# Patient Record
Sex: Female | Born: 1965
Health system: Southern US, Community
[De-identification: ages and names within clinical notes are randomized; demographics above are authoritative.]

## PROBLEM LIST (undated history)

## (undated) DIAGNOSIS — Z8719 Personal history of other diseases of the digestive system: Secondary | ICD-10-CM

## (undated) DIAGNOSIS — F32A Depression, unspecified: Secondary | ICD-10-CM

## (undated) DIAGNOSIS — M1990 Unspecified osteoarthritis, unspecified site: Secondary | ICD-10-CM

## (undated) DIAGNOSIS — I10 Essential (primary) hypertension: Secondary | ICD-10-CM

## (undated) DIAGNOSIS — F329 Major depressive disorder, single episode, unspecified: Secondary | ICD-10-CM

## (undated) DIAGNOSIS — F419 Anxiety disorder, unspecified: Secondary | ICD-10-CM

## (undated) DIAGNOSIS — R519 Headache, unspecified: Secondary | ICD-10-CM

## (undated) DIAGNOSIS — R51 Headache: Secondary | ICD-10-CM

## (undated) DIAGNOSIS — J189 Pneumonia, unspecified organism: Secondary | ICD-10-CM

## (undated) DIAGNOSIS — M792 Neuralgia and neuritis, unspecified: Secondary | ICD-10-CM

## (undated) DIAGNOSIS — K219 Gastro-esophageal reflux disease without esophagitis: Secondary | ICD-10-CM

## (undated) HISTORY — DX: Essential (primary) hypertension: I10

## (undated) HISTORY — DX: Anxiety disorder, unspecified: F41.9

## (undated) HISTORY — DX: Gastro-esophageal reflux disease without esophagitis: K21.9

## (undated) HISTORY — DX: Major depressive disorder, single episode, unspecified: F32.9

## (undated) HISTORY — DX: Depression, unspecified: F32.A

## (undated) HISTORY — PX: WISDOM TOOTH EXTRACTION: SHX21

---

## 1998-10-05 ENCOUNTER — Other Ambulatory Visit: Admission: RE | Admit: 1998-10-05 | Discharge: 1998-10-05 | Payer: Self-pay | Admitting: Family Medicine

## 2001-10-08 ENCOUNTER — Other Ambulatory Visit: Admission: RE | Admit: 2001-10-08 | Discharge: 2001-10-08 | Payer: Self-pay | Admitting: Family Medicine

## 2001-10-10 ENCOUNTER — Encounter: Payer: Self-pay | Admitting: Family Medicine

## 2001-10-10 ENCOUNTER — Ambulatory Visit (HOSPITAL_COMMUNITY): Admission: RE | Admit: 2001-10-10 | Discharge: 2001-10-10 | Payer: Self-pay | Admitting: Family Medicine

## 2002-10-09 ENCOUNTER — Other Ambulatory Visit: Admission: RE | Admit: 2002-10-09 | Discharge: 2002-10-09 | Payer: Self-pay | Admitting: Family Medicine

## 2004-07-12 ENCOUNTER — Ambulatory Visit (HOSPITAL_COMMUNITY): Admission: RE | Admit: 2004-07-12 | Discharge: 2004-07-12 | Payer: Self-pay | Admitting: Family Medicine

## 2004-12-14 ENCOUNTER — Other Ambulatory Visit: Admission: RE | Admit: 2004-12-14 | Discharge: 2004-12-14 | Payer: Self-pay | Admitting: Family Medicine

## 2007-04-19 ENCOUNTER — Encounter: Admission: RE | Admit: 2007-04-19 | Discharge: 2007-04-19 | Payer: Self-pay | Admitting: Family Medicine

## 2007-06-07 ENCOUNTER — Encounter: Admission: RE | Admit: 2007-06-07 | Discharge: 2007-06-07 | Payer: Self-pay | Admitting: Family Medicine

## 2008-08-21 ENCOUNTER — Encounter: Payer: Self-pay | Admitting: Gynecology

## 2008-08-21 ENCOUNTER — Ambulatory Visit: Payer: Self-pay | Admitting: Gynecology

## 2008-08-21 ENCOUNTER — Other Ambulatory Visit: Admission: RE | Admit: 2008-08-21 | Discharge: 2008-08-21 | Payer: Self-pay | Admitting: Gynecology

## 2008-09-18 ENCOUNTER — Ambulatory Visit: Payer: Self-pay | Admitting: Gynecology

## 2009-06-10 ENCOUNTER — Encounter: Admission: RE | Admit: 2009-06-10 | Discharge: 2009-06-10 | Payer: Self-pay | Admitting: Family Medicine

## 2010-06-20 ENCOUNTER — Encounter: Admission: RE | Admit: 2010-06-20 | Discharge: 2010-06-20 | Payer: Self-pay | Admitting: Family Medicine

## 2011-09-06 ENCOUNTER — Other Ambulatory Visit: Payer: Self-pay | Admitting: Family Medicine

## 2011-09-06 DIAGNOSIS — Z1231 Encounter for screening mammogram for malignant neoplasm of breast: Secondary | ICD-10-CM

## 2011-09-28 ENCOUNTER — Ambulatory Visit
Admission: RE | Admit: 2011-09-28 | Discharge: 2011-09-28 | Disposition: A | Discharge status: Home / Self Care | Payer: BC Managed Care – PPO | Source: Ambulatory Visit | Attending: Family Medicine | Admitting: Family Medicine

## 2011-09-28 DIAGNOSIS — Z1231 Encounter for screening mammogram for malignant neoplasm of breast: Secondary | ICD-10-CM

## 2012-03-27 ENCOUNTER — Other Ambulatory Visit: Payer: Self-pay | Admitting: Family Medicine

## 2012-03-27 ENCOUNTER — Other Ambulatory Visit (HOSPITAL_COMMUNITY)
Admission: RE | Admit: 2012-03-27 | Discharge: 2012-03-27 | Disposition: A | Discharge status: Home / Self Care | Payer: BC Managed Care – PPO | Source: Ambulatory Visit | Attending: Family Medicine | Admitting: Family Medicine

## 2012-03-27 DIAGNOSIS — Z0000 Encounter for general adult medical examination without abnormal findings: Secondary | ICD-10-CM | POA: Insufficient documentation

## 2012-04-01 ENCOUNTER — Other Ambulatory Visit: Payer: Self-pay | Admitting: Family Medicine

## 2012-04-01 DIAGNOSIS — R102 Pelvic and perineal pain: Secondary | ICD-10-CM

## 2012-04-05 ENCOUNTER — Ambulatory Visit
Admission: RE | Admit: 2012-04-05 | Discharge: 2012-04-05 | Disposition: A | Discharge status: Home / Self Care | Payer: BC Managed Care – PPO | Source: Ambulatory Visit | Attending: Family Medicine | Admitting: Family Medicine

## 2012-04-05 DIAGNOSIS — R102 Pelvic and perineal pain: Secondary | ICD-10-CM

## 2012-09-24 ENCOUNTER — Other Ambulatory Visit: Payer: Self-pay | Admitting: Family Medicine

## 2012-09-24 DIAGNOSIS — Z1231 Encounter for screening mammogram for malignant neoplasm of breast: Secondary | ICD-10-CM

## 2012-10-11 ENCOUNTER — Ambulatory Visit: Payer: BC Managed Care – PPO

## 2012-12-24 ENCOUNTER — Ambulatory Visit
Admission: RE | Admit: 2012-12-24 | Discharge: 2012-12-24 | Disposition: A | Discharge status: Home / Self Care | Payer: BC Managed Care – PPO | Source: Ambulatory Visit | Attending: Family Medicine | Admitting: Family Medicine

## 2012-12-24 DIAGNOSIS — Z1231 Encounter for screening mammogram for malignant neoplasm of breast: Secondary | ICD-10-CM

## 2013-03-21 ENCOUNTER — Other Ambulatory Visit (HOSPITAL_COMMUNITY)
Admission: RE | Admit: 2013-03-21 | Discharge: 2013-03-21 | Disposition: A | Discharge status: Home / Self Care | Payer: BC Managed Care – PPO | Source: Ambulatory Visit | Attending: Obstetrics and Gynecology | Admitting: Obstetrics and Gynecology

## 2013-03-21 ENCOUNTER — Other Ambulatory Visit: Payer: Self-pay | Admitting: Obstetrics and Gynecology

## 2013-03-21 DIAGNOSIS — Z01419 Encounter for gynecological examination (general) (routine) without abnormal findings: Secondary | ICD-10-CM | POA: Insufficient documentation

## 2013-03-21 DIAGNOSIS — Z1151 Encounter for screening for human papillomavirus (HPV): Secondary | ICD-10-CM | POA: Insufficient documentation

## 2013-12-18 LAB — HM MAMMOGRAPHY: HM MAMMO: NORMAL

## 2014-03-23 ENCOUNTER — Other Ambulatory Visit (HOSPITAL_COMMUNITY)
Admission: RE | Admit: 2014-03-23 | Discharge: 2014-03-23 | Disposition: A | Discharge status: Home / Self Care | Payer: 59 | Source: Ambulatory Visit | Attending: Obstetrics and Gynecology | Admitting: Obstetrics and Gynecology

## 2014-03-23 ENCOUNTER — Other Ambulatory Visit: Payer: Self-pay | Admitting: Obstetrics and Gynecology

## 2014-03-23 DIAGNOSIS — Z01419 Encounter for gynecological examination (general) (routine) without abnormal findings: Secondary | ICD-10-CM | POA: Insufficient documentation

## 2014-03-24 LAB — CYTOLOGY - PAP

## 2014-03-25 LAB — HM PAP SMEAR: HM Pap smear: NORMAL

## 2014-06-22 ENCOUNTER — Ambulatory Visit: Payer: BC Managed Care – PPO | Admitting: Internal Medicine

## 2014-07-13 ENCOUNTER — Ambulatory Visit: Payer: BC Managed Care – PPO | Admitting: Internal Medicine

## 2014-07-24 ENCOUNTER — Other Ambulatory Visit: Payer: Self-pay | Admitting: Internal Medicine

## 2014-07-24 ENCOUNTER — Ambulatory Visit (INDEPENDENT_AMBULATORY_CARE_PROVIDER_SITE_OTHER): Payer: 59 | Admitting: Internal Medicine

## 2014-07-24 ENCOUNTER — Other Ambulatory Visit (INDEPENDENT_AMBULATORY_CARE_PROVIDER_SITE_OTHER): Payer: 59

## 2014-07-24 ENCOUNTER — Encounter: Payer: Self-pay | Admitting: Internal Medicine

## 2014-07-24 VITALS — BP 150/100 | HR 74 | Temp 98.7°F | Resp 16 | Ht 63.0 in | Wt 178.0 lb

## 2014-07-24 DIAGNOSIS — Z1231 Encounter for screening mammogram for malignant neoplasm of breast: Secondary | ICD-10-CM | POA: Insufficient documentation

## 2014-07-24 DIAGNOSIS — Z0000 Encounter for general adult medical examination without abnormal findings: Secondary | ICD-10-CM

## 2014-07-24 DIAGNOSIS — I10 Essential (primary) hypertension: Secondary | ICD-10-CM

## 2014-07-24 LAB — COMPREHENSIVE METABOLIC PANEL
ALT: 14 U/L (ref 0–35)
AST: 19 U/L (ref 0–37)
Albumin: 4.1 g/dL (ref 3.5–5.2)
Alkaline Phosphatase: 71 U/L (ref 39–117)
BILIRUBIN TOTAL: 0.6 mg/dL (ref 0.2–1.2)
BUN: 10 mg/dL (ref 6–23)
CO2: 27 mEq/L (ref 19–32)
Calcium: 9.2 mg/dL (ref 8.4–10.5)
Chloride: 104 mEq/L (ref 96–112)
Creatinine, Ser: 0.7 mg/dL (ref 0.4–1.2)
GFR: 89.1 mL/min (ref >60.00)
GLUCOSE: 92 mg/dL (ref 70–99)
Potassium: 4.2 mEq/L (ref 3.5–5.1)
SODIUM: 138 meq/L (ref 135–145)
Total Protein: 7.1 g/dL (ref 6.0–8.3)

## 2014-07-24 LAB — URINALYSIS, ROUTINE W REFLEX MICROSCOPIC
Bilirubin Urine: NEGATIVE
Hgb urine dipstick: NEGATIVE
Ketones, ur: NEGATIVE
Leukocytes, UA: NEGATIVE
Nitrite: NEGATIVE
PH: 7 (ref 5.0–8.0)
RBC / HPF: NONE SEEN (ref 0–?)
Specific Gravity, Urine: 1.01 (ref 1.000–1.030)
TOTAL PROTEIN, URINE-UPE24: NEGATIVE
Urine Glucose: NEGATIVE
Urobilinogen, UA: 0.2 (ref 0.0–1.0)
WBC, UA: NONE SEEN (ref 0–?)

## 2014-07-24 LAB — CBC WITH DIFFERENTIAL/PLATELET
Basophils Absolute: 0.1 10*3/uL (ref 0.0–0.1)
Basophils Relative: 0.8 % (ref 0.0–3.0)
EOS PCT: 1.3 % (ref 0.0–5.0)
Eosinophils Absolute: 0.1 10*3/uL (ref 0.0–0.7)
HEMATOCRIT: 40.5 % (ref 36.0–46.0)
Hemoglobin: 13.7 g/dL (ref 12.0–15.0)
LYMPHS ABS: 1.7 10*3/uL (ref 0.7–4.0)
Lymphocytes Relative: 24.2 % (ref 12.0–46.0)
MCHC: 33.7 g/dL (ref 30.0–36.0)
MCV: 86.4 fl (ref 78.0–100.0)
MONOS PCT: 6.3 % (ref 3.0–12.0)
Monocytes Absolute: 0.4 10*3/uL (ref 0.1–1.0)
NEUTROS PCT: 67.4 % (ref 43.0–77.0)
Neutro Abs: 4.6 10*3/uL (ref 1.4–7.7)
Platelets: 267 10*3/uL (ref 150.0–400.0)
RBC: 4.69 Mil/uL (ref 3.87–5.11)
RDW: 12.8 % (ref 11.5–15.5)
WBC: 6.8 10*3/uL (ref 4.0–10.5)

## 2014-07-24 LAB — LIPID PANEL
Cholesterol: 181 mg/dL (ref 0–200)
HDL: 34.1 mg/dL — ABNORMAL LOW (ref >39.00)
LDL Cholesterol: 125 mg/dL — ABNORMAL HIGH (ref 0–99)
NonHDL: 146.9
Total CHOL/HDL Ratio: 5
Triglycerides: 108 mg/dL (ref 0.0–149.0)
VLDL: 21.6 mg/dL (ref 0.0–40.0)

## 2014-07-24 MED ORDER — AZILSARTAN-CHLORTHALIDONE 40-12.5 MG PO TABS: 1.0000 | ORAL_TABLET | Freq: Every day | ORAL | Status: DC

## 2014-07-24 NOTE — Progress Notes (Signed)
   Subjective:    Patient ID: Hannah Miranda, female    DOB: 1965-10-01, 48 y.o.   MRN: [id number]  Hypertension This is a recurrent problem. The current episode started more than 1 year ago. The problem has been gradually worsening since onset. The problem is uncontrolled. Associated symptoms include anxiety. Pertinent negatives include no blurred vision, chest pain, headaches, malaise/fatigue, neck pain, orthopnea, palpitations, peripheral edema, PND, shortness of breath or sweats. There are no associated agents to hypertension. Past treatments include nothing. The current treatment provides no improvement. Compliance problems include diet and exercise.       Review of Systems  Constitutional: Negative.  Negative for fever, chills, malaise/fatigue, diaphoresis, appetite change and fatigue.  HENT: Negative.   Eyes: Negative.  Negative for blurred vision.  Respiratory: Negative.  Negative for cough, choking, chest tightness, shortness of breath and stridor.   Cardiovascular: Negative.  Negative for chest pain, palpitations, orthopnea, leg swelling and PND.  Gastrointestinal: Negative.  Negative for nausea, vomiting, abdominal pain, diarrhea, constipation and blood in stool.  Endocrine: Negative.   Genitourinary: Negative.   Musculoskeletal: Negative.  Negative for back pain, arthralgias and neck pain.  Skin: Negative.   Allergic/Immunologic: Negative.   Neurological: Negative.  Negative for headaches.  Hematological: Negative.  Negative for adenopathy. Does not bruise/bleed easily.  Psychiatric/Behavioral: Negative.        Objective:   Physical Exam  Constitutional: She is oriented to person, place, and time. She appears well-developed and well-nourished. No distress.  HENT:  Head: Normocephalic and atraumatic.  Mouth/Throat: Oropharynx is clear and moist. No oropharyngeal exudate.  Eyes: Conjunctivae are normal. Right eye exhibits no discharge. Left eye exhibits no discharge. No  scleral icterus.  Neck: Normal range of motion. Neck supple. No JVD present. No tracheal deviation present. No thyromegaly present.  Cardiovascular: Normal rate, regular rhythm, normal heart sounds and intact distal pulses.  Exam reveals no gallop and no friction rub.   No murmur heard. Pulmonary/Chest: Effort normal and breath sounds normal. No stridor. No respiratory distress. She has no wheezes. She has no rales. She exhibits no tenderness.  Abdominal: Soft. Bowel sounds are normal. She exhibits no distension and no mass. There is no tenderness. There is no rebound and no guarding.  Musculoskeletal: Normal range of motion. She exhibits no edema or tenderness.  Lymphadenopathy:    She has no cervical adenopathy.  Neurological: She is oriented to person, place, and time.  Skin: Skin is warm and dry. No rash noted. She is not diaphoretic. No erythema. No pallor.  Vitals reviewed.     No results found for: WBC, HGB, HCT, PLT, GLUCOSE, CHOL, TRIG, HDL, LDLDIRECT, LDLCALC, ALT, AST, NA, K, CL, CREATININE, BUN, CO2, TSH, PSA, INR, GLUF, HGBA1C, MICROALBUR    Assessment & Plan:

## 2014-07-24 NOTE — Patient Instructions (Signed)

## 2014-07-25 NOTE — Assessment & Plan Note (Signed)
Her BP is not well controlled I have asked her to start am ARB/HCTZ combo I will check her labs today to look for end organ damage and secondary causes of HTN

## 2014-07-25 NOTE — Assessment & Plan Note (Signed)
Exam done Vaccines were reviewed and updated She is not due for PAP or mammo Labs ordered Pt ed material was given

## 2014-07-27 ENCOUNTER — Telehealth: Payer: Self-pay | Admitting: Internal Medicine

## 2014-07-27 NOTE — Telephone Encounter (Signed)
emmi mailed  °

## 2014-07-28 ENCOUNTER — Encounter: Payer: Self-pay | Admitting: Internal Medicine

## 2014-07-28 LAB — TSH: TSH: 2.054 u[IU]/mL (ref 0.350–4.500)

## 2014-09-01 ENCOUNTER — Ambulatory Visit (INDEPENDENT_AMBULATORY_CARE_PROVIDER_SITE_OTHER): Payer: 59 | Admitting: Internal Medicine

## 2014-09-01 ENCOUNTER — Encounter: Payer: Self-pay | Admitting: Internal Medicine

## 2014-09-01 VITALS — BP 120/78 | HR 84 | Temp 98.5°F | Resp 16 | Ht 63.0 in | Wt 175.0 lb

## 2014-09-01 DIAGNOSIS — I10 Essential (primary) hypertension: Secondary | ICD-10-CM

## 2014-09-01 DIAGNOSIS — R202 Paresthesia of skin: Secondary | ICD-10-CM

## 2014-09-01 DIAGNOSIS — G5601 Carpal tunnel syndrome, right upper limb: Secondary | ICD-10-CM | POA: Insufficient documentation

## 2014-09-01 NOTE — Patient Instructions (Signed)

## 2014-09-01 NOTE — Assessment & Plan Note (Signed)
Her BP is well controlled 

## 2014-09-01 NOTE — Progress Notes (Signed)
Pre visit review using our clinic review tool, if applicable. No additional management support is needed unless otherwise documented below in the visit note. 

## 2014-09-01 NOTE — Assessment & Plan Note (Signed)
In the left thigh her s/s are consistent with meralgia paresthetica but the numbness in her arms is a different story. I gave her an info sheet [facility] website about meralgia paresthetica and she will consider the treatment options. I have ordered a NCS/EMG to gather more info about the cause for her paresthesias.

## 2014-09-01 NOTE — Progress Notes (Signed)
   Subjective:    Patient ID: Hannah Miranda, female    DOB: February 28, 1966, 49 y.o.   MRN: [id number]  HPI  She complains of numbness and aching in her left distal, anterior, lateral thigh for several months, this worsened after a fall that she sustained about 1 month ago. The symptoms are intermittent but are worsening. She also complains of intermittent numbness in both hands for several months but that occurs mostly during the night.  Review of Systems  Constitutional: Negative.  Negative for fever, chills, diaphoresis, appetite change and fatigue.  HENT: Negative.   Eyes: Negative.   Respiratory: Negative.  Negative for cough, choking, chest tightness, shortness of breath and stridor.   Cardiovascular: Negative.  Negative for chest pain, palpitations and leg swelling.  Gastrointestinal: Negative.  Negative for nausea, vomiting, abdominal pain, diarrhea, constipation and blood in stool.  Endocrine: Negative.   Genitourinary: Negative.   Musculoskeletal: Positive for arthralgias (left thigh). Negative for myalgias, back pain, joint swelling and neck stiffness.  Skin: Negative.  Negative for rash.  Allergic/Immunologic: Negative.   Neurological: Positive for numbness. Negative for dizziness, tremors, seizures, syncope, facial asymmetry, speech difficulty, weakness, light-headedness and headaches.  Hematological: Negative.  Negative for adenopathy. Does not bruise/bleed easily.  Psychiatric/Behavioral: Negative for confusion, sleep disturbance, self-injury, decreased concentration and agitation. The patient is nervous/anxious.        Objective:   Physical Exam  Constitutional: She appears well-developed and well-nourished. No distress.  HENT:  Head: Normocephalic and atraumatic.  Mouth/Throat: Oropharynx is clear and moist. No oropharyngeal exudate.  Eyes: Conjunctivae are normal. Right eye exhibits no discharge. Left eye exhibits no discharge. No scleral icterus.  Neck: Normal range of  motion. Neck supple. No JVD present. No tracheal deviation present. No thyromegaly present.  Cardiovascular: Normal rate, regular rhythm, normal heart sounds and intact distal pulses.  Exam reveals no gallop and no friction rub.   No murmur heard. Pulmonary/Chest: Effort normal and breath sounds normal. No stridor. No respiratory distress. She has no wheezes. She has no rales. She exhibits no tenderness.  Abdominal: Soft. Bowel sounds are normal. She exhibits no distension and no mass. There is no tenderness. There is no rebound and no guarding.  Musculoskeletal: Normal range of motion. She exhibits no edema or tenderness.  Lymphadenopathy:    She has no cervical adenopathy.  Neurological: She is alert. She has normal strength. She displays abnormal reflex. She displays no atrophy and no tremor. No cranial nerve deficit or sensory deficit. She exhibits normal muscle tone. She displays a negative Romberg sign. She displays no seizure activity. Coordination and gait normal.  Reflex Scores:      Tricep reflexes are 1+ on the right side and 1+ on the left side.      Bicep reflexes are 2+ on the right side and 2+ on the left side.      Brachioradialis reflexes are 1+ on the right side and 1+ on the left side.      Patellar reflexes are 1+ on the right side and 1+ on the left side.      Achilles reflexes are 1+ on the right side and 0 on the left side. Skin: Skin is warm and dry. No rash noted. She is not diaphoretic. No erythema. No pallor.  Psychiatric: She has a normal mood and affect. Her behavior is normal. Judgment and thought content normal.  Vitals reviewed.         Assessment & Plan:

## 2014-10-08 ENCOUNTER — Other Ambulatory Visit: Payer: Self-pay | Admitting: *Deleted

## 2014-10-08 ENCOUNTER — Ambulatory Visit (INDEPENDENT_AMBULATORY_CARE_PROVIDER_SITE_OTHER): Payer: 59 | Admitting: Neurology

## 2014-10-08 DIAGNOSIS — R200 Anesthesia of skin: Secondary | ICD-10-CM

## 2014-10-08 NOTE — Procedures (Signed)
[facility]  [address], [address]  [place], [place] [address] Tel: [phone] Fax:  [phone] Test Date:  10/08/2014  Patient: Hannah Miranda DOB: [birth year] Physician: Nita Sickleonika Patel  Sex: Female Height: 5\' 3"  Ref Phys: Sanda LingerJones, Thomas   ID#: [id number] Temp: 32.0 Technician: Ala BentSusan Reid   Patient Complaints: Patient is a 49 year old female here for evaluation of generalized paresthesias of the arms and legs.   NCV & EMG Findings: Nerve conduction study of the right upper extremity, left lower extremity and additional studies of the left upper extremity shows:  1. Right median, ulnar, and radial sensory responses are within normal limits. Palmar studies are abnormal on the right and within normal limits on the left. 2. Right median and ulnar motor responses within normal limits. 3. Left sural and superficial peroneal sensory responses are within normal limits. 4. Left peroneal and tibial motor responses within normal limits.  Needle electrode examination of the right upper extremity and right lower extremity shows no evidence of active or chronic motor axon loss changes. In particular, motor unit configuration and recruitment pattern is within normal limits.  Impression: 1. Right median neuropathy at or distal to the wrist, consistent with the clinical diagnosis of carpal tunnel syndrome; very mild in degree electrically. 2. There is no evidence of carpal tunnel syndrome or a generalized sensorimotor polyneuropathy affecting the left side. Additionally, there is no evidence of a cervical/lumbosacral radiculopathy affecting the right side.   ___________________________ Nita Sickleonika Patel    Nerve Conduction Studies Anti Sensory Summary Table   Stim Site NR Peak (ms) Norm Peak (ms) P-T Amp (V) Norm P-T Amp  Right Median Anti Sensory (2nd Digit)  32C  Wrist    3.1 <3.4 51.4 >20  Right Radial Anti Sensory (Base 1st Digit)  32C  Wrist    2.0 <2.7 31.2 >18  Left Sup  Peroneal Anti Sensory (Ant Lat Mall)  32C  12 cm    2.1 <4.5 14.4 >5  Left Sural Anti Sensory (Lat Mall)  Calf    3.0 <4.5 15.9 >5  Right Ulnar Anti Sensory (5th Digit)  32C  Wrist    2.4 <3.1 32.5 >12   Motor Summary Table   Stim Site NR Onset (ms) Norm Onset (ms) O-P Amp (mV) Norm O-P Amp Site1 Site2 Delta-0 (ms) Dist (cm) Vel (m/s) Norm Vel (m/s)  Right Median Motor (Abd Poll Brev)  32C  Wrist    2.7 <3.9 9.7 >6 Elbow Wrist 4.0 25.0 63 >50  Elbow    6.7  9.4         Left Peroneal Motor (Ext Dig Brev)  32C  Ankle    2.7 <5.5 5.4 >3 B Fib Ankle 5.7 32.0 56 >40  B Fib    8.4  5.4  Poplt B Fib 1.3 7.5 58 >40  Poplt    9.7  5.3         Left Peroneal TA Motor (Tib Ant)  32C  Fib Head    2.0 <4.0 5.1 >4 Poplit Fib Head 1.4 8.0 57 >40  Poplit    3.4  4.2         Left Tibial Motor (Abd Hall Brev)  32C  Ankle    2.9 <6.0 9.0 >8 Knee Ankle 6.8 35.0 51 >40  Knee    9.7  8.1         Right Ulnar Motor (Abd Dig Minimi)  33C  Wrist    1.6 <3.1  8.2 >7 B Elbow Wrist 3.2 21.0 66 >50  B Elbow    4.8  8.0  A Elbow B Elbow 1.5 10.0 67 >50  A Elbow    6.3  8.0          Comparison Summary Table   Stim Site NR Peak (ms) Norm Peak (ms) P-T Amp (V) Site1 Site2 Delta-P (ms) Norm Delta (ms)  Left Median/Ulnar Palm Comparison (Wrist - 8cm)  33C  Median Palm    1.7 <2.2 59.4 Median Palm Ulnar Palm 0.2   Ulnar Palm    1.5 <2.2 11.5      Right Median/Ulnar Palm Comparison (Wrist - 8cm)  33C  Median Palm    1.8 <2.2 109.2 Median Palm Ulnar Palm 0.5   Ulnar Palm    1.3 <2.2 22.2        H Reflex Studies   NR H-Lat (ms) Lat Norm (ms) L-R H-Lat (ms)  Left Tibial (Gastroc)  32C     30.75 <35    EMG   Side Muscle Ins Act Fibs Psw Fasc Number Recrt Dur Dur. Amp Amp. Poly Poly. Comment  Right 1stDorInt Nml Nml Nml Nml Nml Nml Nml Nml Nml Nml Nml Nml N/A  Right Abd Poll Brev Nml Nml Nml Nml Nml Nml Nml Nml Nml Nml Nml Nml N/A  Right PronatorTeres Nml Nml Nml Nml Nml Nml Nml Nml Nml Nml Nml Nml  N/A  Right Biceps Nml Nml Nml Nml Nml Nml Nml Nml Nml Nml Nml Nml N/A  Right Triceps Nml Nml Nml Nml Nml Nml Nml Nml Nml Nml Nml Nml N/A  Right Deltoid Nml Nml Nml Nml Nml Nml Nml Nml Nml Nml Nml Nml N/A  Left AntTibialis Nml Nml Nml Nml Nml Nml Nml Nml Nml Nml Nml Nml N/A  Left Gastroc Nml Nml Nml Nml Nml Nml Nml Nml Nml Nml Nml Nml N/A  Left Flex Dig Long Nml Nml Nml Nml Nml Nml Nml Nml Nml Nml Nml Nml N/A  Left RectFemoris Nml Nml Nml Nml Nml Nml Nml Nml Nml Nml Nml Nml N/A  Left GluteusMed Nml Nml Nml Nml Nml Nml Nml Nml Nml Nml Nml Nml N/A      Waveforms:

## 2014-10-16 ENCOUNTER — Encounter: Payer: Self-pay | Admitting: Internal Medicine

## 2014-10-19 ENCOUNTER — Encounter: Payer: Self-pay | Admitting: Internal Medicine

## 2014-10-19 ENCOUNTER — Ambulatory Visit (INDEPENDENT_AMBULATORY_CARE_PROVIDER_SITE_OTHER): Payer: 59 | Admitting: Internal Medicine

## 2014-10-19 VITALS — BP 122/64 | HR 93 | Temp 98.7°F | Resp 16 | Ht 63.0 in | Wt 179.0 lb

## 2014-10-19 DIAGNOSIS — I10 Essential (primary) hypertension: Secondary | ICD-10-CM

## 2014-10-19 DIAGNOSIS — G5712 Meralgia paresthetica, left lower limb: Secondary | ICD-10-CM

## 2014-10-19 DIAGNOSIS — G5601 Carpal tunnel syndrome, right upper limb: Secondary | ICD-10-CM

## 2014-10-19 NOTE — Progress Notes (Signed)
Pre visit review using our clinic review tool, if applicable. No additional management support is needed unless otherwise documented below in the visit note. 

## 2014-10-21 ENCOUNTER — Encounter: Payer: Self-pay | Admitting: Internal Medicine

## 2014-10-21 DIAGNOSIS — G5712 Meralgia paresthetica, left lower limb: Secondary | ICD-10-CM | POA: Insufficient documentation

## 2014-10-21 MED ORDER — GABAPENTIN 100 MG PO CAPS: 100.0000 mg | ORAL_CAPSULE | Freq: Three times a day (TID) | ORAL | Status: DC

## 2014-10-21 NOTE — Assessment & Plan Note (Signed)
A splint was applied today She does not want to consider OT or surgery at this time

## 2014-10-21 NOTE — Assessment & Plan Note (Signed)
She is aware of the exercises and physical activity to help resolve this She also wants to try a medication for the discomfort - will offer neurontin

## 2014-10-21 NOTE — Progress Notes (Signed)
Subjective:    Patient ID: Hannah Miranda, female    DOB: 09/12/1965, 49 y.o.   MRN: [id number]  HPI Comments: She returns for f/up regarding paresthesias and recent NCS/EMG results. She has pain and paresthesia on right hand/wrist and left thigh.     Review of Systems  Constitutional: Negative.  Negative for fever, chills, diaphoresis, appetite change and fatigue.  HENT: Negative.   Eyes: Negative.   Respiratory: Negative.  Negative for cough, choking, chest tightness, shortness of breath and stridor.   Cardiovascular: Negative.  Negative for chest pain, palpitations and leg swelling.  Gastrointestinal: Negative.  Negative for nausea, vomiting, abdominal pain, diarrhea, constipation and blood in stool.  Endocrine: Negative.   Genitourinary: Negative.   Musculoskeletal: Positive for myalgias (left thigh) and arthralgias (rt hand and wrist). Negative for back pain, gait problem and neck pain.  Skin: Negative.  Negative for rash.  Allergic/Immunologic: Negative.   Neurological: Positive for numbness. Negative for dizziness, weakness and light-headedness.  Hematological: Negative.  Negative for adenopathy. Does not bruise/bleed easily.  Psychiatric/Behavioral: Negative.        Objective:   Physical Exam  Constitutional: She is oriented to person, place, and time. She appears well-developed and well-nourished. No distress.  HENT:  Head: Normocephalic and atraumatic.  Mouth/Throat: Oropharynx is clear and moist. No oropharyngeal exudate.  Eyes: Conjunctivae are normal. Right eye exhibits no discharge. Left eye exhibits no discharge. No scleral icterus.  Neck: Normal range of motion. Neck supple. No JVD present. No tracheal deviation present. No thyromegaly present.  Cardiovascular: Normal rate, regular rhythm, normal heart sounds and intact distal pulses.  Exam reveals no gallop and no friction rub.   No murmur heard. Pulmonary/Chest: Effort normal and breath sounds normal. No  stridor. No respiratory distress. She has no wheezes. She has no rales. She exhibits no tenderness.  Abdominal: Soft. Bowel sounds are normal. She exhibits no distension and no mass. There is no tenderness. There is no rebound and no guarding.  Musculoskeletal: Normal range of motion. She exhibits no edema or tenderness.  Lymphadenopathy:    She has no cervical adenopathy.  Neurological: She is oriented to person, place, and time.  Skin: Skin is warm and dry. No rash noted. She is not diaphoretic. No erythema. No pallor.  Psychiatric: She has a normal mood and affect. Her behavior is normal. Judgment and thought content normal.  Vitals reviewed.     NCV & EMG Findings: Nerve conduction study of the right upper extremity, left lower extremity and additional studies of the left upper extremity shows:  1. Right median, ulnar, and radial sensory responses are within normal limits. Palmar studies are abnormal on the right and within normal limits on the left. 2. Right median and ulnar motor responses within normal limits. 3. Left sural and superficial peroneal sensory responses are within normal limits. 4. Left peroneal and tibial motor responses within normal limits.  Needle electrode examination of the right upper extremity and right lower extremity shows no evidence of active or chronic motor axon loss changes. In particular, motor unit configuration and recruitment pattern is within normal limits.  Impression: 1. Right median neuropathy at or distal to the wrist, consistent with the clinical diagnosis of carpal tunnel syndrome; very mild in degree electrically. 2. There is no evidence of carpal tunnel syndrome or a generalized sensorimotor polyneuropathy affecting the left side. Additionally, there is no evidence of a cervical/lumbosacral radiculopathy affecting the right side.   ___________________________  Assessment & Plan:

## 2014-10-21 NOTE — Assessment & Plan Note (Signed)
Her BP is well controlled 

## 2014-10-23 ENCOUNTER — Ambulatory Visit: Payer: 59 | Admitting: Internal Medicine

## 2014-11-25 ENCOUNTER — Other Ambulatory Visit: Payer: Self-pay | Admitting: Internal Medicine

## 2015-01-26 ENCOUNTER — Ambulatory Visit (INDEPENDENT_AMBULATORY_CARE_PROVIDER_SITE_OTHER): Payer: 59 | Admitting: Internal Medicine

## 2015-01-26 ENCOUNTER — Encounter: Payer: Self-pay | Admitting: Internal Medicine

## 2015-01-26 VITALS — BP 122/84 | HR 91 | Temp 98.3°F | Resp 18 | Wt 175.0 lb

## 2015-01-26 DIAGNOSIS — J029 Acute pharyngitis, unspecified: Secondary | ICD-10-CM

## 2015-01-26 DIAGNOSIS — J309 Allergic rhinitis, unspecified: Secondary | ICD-10-CM

## 2015-01-26 MED ORDER — CEPHALEXIN 500 MG PO CAPS: 500.0000 mg | ORAL_CAPSULE | Freq: Two times a day (BID) | ORAL | Status: DC

## 2015-01-26 NOTE — Progress Notes (Signed)
Pre visit review using our clinic review tool, if applicable. No additional management support is needed unless otherwise documented below in the visit note. 

## 2015-01-26 NOTE — Progress Notes (Signed)
   Subjective:    Patient ID: Hannah Miranda, female    DOB: 21-May-1966, 49 y.o.   MRN: [id number]  HPI Her symptoms began 4 days ago as sore throat which has been severe and progressive. This is associated with malaise, rhinitis, sneezing, and watery eyes. Cough has been nonproductive. She's had some fever and chills.  She takes Singulair for seasonal allergies with good response. She's never smoked. She has no history of asthma.  Review of Systems  She denies frontal headache, facial pain, nasal purulence, dental pain, wheezing, shortness of breath.       Objective:   Physical Exam  General appearance:Adequately nourished; no acute distress or increased work of breathing is present.    Lymphatic: She has shotty cervical lymphadenopathy, greater on the left than the right. No axillary lymphadenopathy.  Eyes: No conjunctival inflammation or lid edema is present. There is no scleral icterus.  Ears:  External ear exam shows no significant lesions or deformities.  Otoscopic examination reveals clear canals, tympanic membranes are intact bilaterally without bulging, retraction, inflammation or discharge.  Nose:  External nasal examination shows no deformity or inflammation. Nasal mucosa are markedly erythematous without lesions or exudates No septal dislocation or deviation.No obstruction to airflow.   Oral exam: Dental hygiene is good; lips and gums are healthy appearing.There is no oropharyngeal erythema or exudate .  Neck:  No deformities, thyromegaly, masses, or tenderness noted.   Supple with full range of motion without pain.   Heart:  Normal rate and regular rhythm. S1 and S2 normal without gallop, murmur, click, rub or other extra sounds.   Lungs:Chest clear to auscultation; no wheezes, rhonchi,rales ,or rubs present.  Extremities:  No cyanosis, edema, or clubbing  noted    Skin: Warm & dry w/o tenting or jaundice. No significant lesions or rash.        Assessment &  Plan:  #1 pharyngitis .  #2 allergic rhinitis.  See orders and recommendations

## 2015-01-26 NOTE — Patient Instructions (Signed)
Plain Mucinex (NOT D) for thick secretions ;force NON dairy fluids .   Nasal cleansing in the shower as discussed with lather of mild shampoo.After 10 seconds wash off lather while  exhaling through nostrils. Make sure that all residual soap is removed to prevent irritation.  Fluticasone 1 spray in each nostril twice a day as needed. Use the "crossover" technique into opposite nostril spraying toward opposite ear @ 45 degree angle, not straight up into nostril.  Use a Neti pot daily only  as needed for significant sinus congestion; going from open side to congested side . Plain Allegra (NOT D )  160 daily , Loratidine 10 mg , OR Zyrtec 10 mg @ bedtime  as needed for itchy eyes & sneezing.    Zicam Melts or Zinc lozenges as per package label for sore throat .  Complementary options to boost immunity include  vitamin C 2000 mg daily; & Echinacea for 4-7 days.

## 2015-02-23 ENCOUNTER — Ambulatory Visit (INDEPENDENT_AMBULATORY_CARE_PROVIDER_SITE_OTHER): Payer: 59 | Admitting: Internal Medicine

## 2015-02-23 ENCOUNTER — Encounter: Payer: Self-pay | Admitting: Internal Medicine

## 2015-02-23 ENCOUNTER — Ambulatory Visit (INDEPENDENT_AMBULATORY_CARE_PROVIDER_SITE_OTHER)
Admission: RE | Admit: 2015-02-23 | Discharge: 2015-02-23 | Disposition: A | Discharge status: Home / Self Care | Payer: 59 | Source: Ambulatory Visit | Attending: Internal Medicine | Admitting: Internal Medicine

## 2015-02-23 VITALS — BP 112/86 | HR 74 | Temp 98.3°F | Resp 16 | Ht 63.0 in | Wt 179.0 lb

## 2015-02-23 DIAGNOSIS — R059 Cough, unspecified: Secondary | ICD-10-CM

## 2015-02-23 DIAGNOSIS — R05 Cough: Secondary | ICD-10-CM

## 2015-02-23 DIAGNOSIS — J189 Pneumonia, unspecified organism: Secondary | ICD-10-CM

## 2015-02-23 MED ORDER — HYDROCODONE-HOMATROPINE 5-1.5 MG/5ML PO SYRP
5.0000 mL | ORAL_SOLUTION | Freq: Three times a day (TID) | ORAL | Status: DC | PRN
Start: 2015-02-23 — End: 2015-05-03

## 2015-02-23 MED ORDER — MOXIFLOXACIN HCL 400 MG PO TABS
400.0000 mg | ORAL_TABLET | Freq: Every day | ORAL | Status: AC
Start: 2015-02-23 — End: 2015-03-02

## 2015-02-23 NOTE — Progress Notes (Signed)
Pre visit review using our clinic review tool, if applicable. No additional management support is needed unless otherwise documented below in the visit note. 

## 2015-02-23 NOTE — Progress Notes (Signed)
Subjective:  Patient ID: Hannah Miranda, female    DOB: [birth year]  Age: 49 y.o. MRN: [id number]  CC: Cough   HPI Hannah Kindredngela C Chuang presents for cough productive of thick phlegm for about 1 week. She also complains of chills and sore throat. The cough can be so severe at times that she has developed right sided chest soreness.  Outpatient Prescriptions Prior to Visit  Medication Sig Dispense Refill  . ALPRAZolam (XANAX) 0.5 MG tablet TAKE 1 TABLET BY MOUTH TWICE A DAY AS NEEDED 90 tablet 2  . Azilsartan-Chlorthalidone (EDARBYCLOR) 40-12.5 MG TABS Take 1 tablet by mouth daily. 30 tablet 11  . cetirizine (ZYRTEC) 10 MG tablet Take 10 mg by mouth daily.    . mometasone (NASONEX) 50 MCG/ACT nasal spray Place 2 sprays into the nose as needed.    . montelukast (SINGULAIR) 10 MG tablet Take 10 mg by mouth as needed. SPRING [employer]ALL    . omeprazole (PRILOSEC) 20 MG capsule Take 20 mg by mouth daily.    . cephALEXin (KEFLEX) 500 MG capsule Take 1 capsule (500 mg total) by mouth 2 (two) times daily. 14 capsule 0  . gabapentin (NEURONTIN) 100 MG capsule Take 1 capsule (100 mg total) by mouth 3 (three) times daily. (Patient not taking: Reported on 02/23/2015) 90 capsule 3   No facility-administered medications prior to visit.    ROS Review of Systems  Constitutional: Positive for chills. Negative for fever, diaphoresis, activity change, appetite change, fatigue and unexpected weight change.  HENT: Positive for sore throat. Negative for congestion, sinus pressure, tinnitus and trouble swallowing.   Eyes: Negative.   Respiratory: Positive for cough. Negative for apnea, choking, chest tightness, shortness of breath, wheezing and stridor.   Cardiovascular: Positive for chest pain. Negative for palpitations and leg swelling.  Gastrointestinal: Negative.  Negative for nausea, vomiting, abdominal pain, diarrhea, constipation and blood in stool.  Endocrine: Negative.   Genitourinary: Negative.     Musculoskeletal: Negative.  Negative for myalgias, back pain, joint swelling and arthralgias.  Skin: Negative.  Negative for rash.  Allergic/Immunologic: Negative.   Neurological: Negative.   Hematological: Negative.  Negative for adenopathy. Does not bruise/bleed easily.  Psychiatric/Behavioral: Negative.     Objective:  BP 112/86 mmHg  Pulse 74  Temp(Src) 98.3 F (36.8 C) (Oral)  Resp 16  Ht 5\' 3"  (1.6 m)  Wt 179 lb (81.194 kg)  BMI 31.72 kg/m2  SpO2 97%  BP Readings from Last 3 Encounters:  02/23/15 112/86  01/26/15 122/84  10/19/14 122/64    Wt Readings from Last 3 Encounters:  02/23/15 179 lb (81.194 kg)  01/26/15 175 lb (79.379 kg)  10/19/14 179 lb (81.194 kg)    Physical Exam  Constitutional: She is oriented to person, place, and time.  Non-toxic appearance. She does not have a sickly appearance. She does not appear ill. No distress.  HENT:  Mouth/Throat: Oropharynx is clear and moist. No oropharyngeal exudate.  Eyes: Conjunctivae are normal. Right eye exhibits no discharge. Left eye exhibits no discharge. No scleral icterus.  Neck: Normal range of motion. Neck supple. No JVD present. No tracheal deviation present. No thyromegaly present.  Cardiovascular: Normal rate, regular rhythm, normal heart sounds and intact distal pulses.  Exam reveals no gallop and no friction rub.   No murmur heard. Pulmonary/Chest: Effort normal and breath sounds normal. No stridor. No respiratory distress. She has no wheezes. She has no rales. She exhibits no tenderness.  Abdominal: Soft. Bowel sounds are  normal. She exhibits no distension and no mass. There is no tenderness. There is no rebound and no guarding.  Musculoskeletal: Normal range of motion. She exhibits no edema or tenderness.  Lymphadenopathy:    She has no cervical adenopathy.  Neurological: She is oriented to person, place, and time.  Skin: Skin is warm and dry. No rash noted. She is not diaphoretic. No erythema. No  pallor.    Lab Results  Component Value Date   WBC 6.8 07/24/2014   HGB 13.7 07/24/2014   HCT 40.5 07/24/2014   PLT 267.0 07/24/2014   GLUCOSE 92 07/24/2014   CHOL 181 07/24/2014   TRIG 108.0 07/24/2014   HDL 34.10* 07/24/2014   LDLCALC 125* 07/24/2014   ALT 14 07/24/2014   AST 19 07/24/2014   NA 138 07/24/2014   K 4.2 07/24/2014   CL 104 07/24/2014   CREATININE 0.7 07/24/2014   BUN 10 07/24/2014   CO2 27 07/24/2014   TSH 2.054 07/24/2014    Dg Chest 2 View  02/23/2015   CLINICAL DATA:  Chest tightness/pain and cough for 3 days  EXAM: CHEST  2 VIEW  COMPARISON:  None.  FINDINGS: Lungs are clear. Heart size and pulmonary vascularity are normal. No adenopathy. No pneumothorax. No bone lesions.  IMPRESSION: No edema or consolidation.   Electronically Signed   By: Bretta Bang III M.D.   On: 02/23/2015 08:56    Assessment & Plan:   Lashundra was seen today for cough.  Diagnoses and all orders for this visit:  Cough- her chest x-ray is normal, will control the cough with Hycodan Orders: -     DG Chest 2 View; Future -     HYDROcodone-homatropine (HYCODAN) 5-1.5 MG/5ML syrup; Take 5 mLs by mouth every 8 (eight) hours as needed for cough.  CAP (community acquired pneumonia)- though her chest x-ray is normal she has signs and symptoms suspicious for pneumonia. Will treat with Avelox. Orders: -     moxifloxacin (AVELOX) 400 MG tablet; Take 1 tablet (400 mg total) by mouth daily. -     HYDROcodone-homatropine (HYCODAN) 5-1.5 MG/5ML syrup; Take 5 mLs by mouth every 8 (eight) hours as needed for cough.   I have discontinued Ms. Burchfield's gabapentin and cephALEXin. I am also having her start on moxifloxacin and HYDROcodone-homatropine. Additionally, I am having her maintain her omeprazole, cetirizine, montelukast, mometasone, Azilsartan-Chlorthalidone, and ALPRAZolam.  Meds ordered this encounter  Medications  . moxifloxacin (AVELOX) 400 MG tablet    Sig: Take 1 tablet (400 mg  total) by mouth daily.    Dispense:  7 tablet    Refill:  0  . HYDROcodone-homatropine (HYCODAN) 5-1.5 MG/5ML syrup    Sig: Take 5 mLs by mouth every 8 (eight) hours as needed for cough.    Dispense:  120 mL    Refill:  0     Follow-up: Return in about 3 weeks (around 03/16/2015).  Sanda Linger, MD

## 2015-02-23 NOTE — Patient Instructions (Signed)
Cough, Adult  A cough is a reflex that helps clear your throat and airways. It can help heal the body or may be a reaction to an irritated airway. A cough may only last 2 or 3 weeks (acute) or may last more than 8 weeks (chronic).  CAUSES Acute cough:  Viral or bacterial infections. Chronic cough:  Infections.  Allergies.  Asthma.  Post-nasal drip.  Smoking.  Heartburn or acid reflux.  Some medicines.  Chronic lung problems (COPD).  Cancer. SYMPTOMS   Cough.  Fever.  Chest pain.  Increased breathing rate.  High-pitched whistling sound when breathing (wheezing).  Colored mucus that you cough up (sputum). TREATMENT   A bacterial cough may be treated with antibiotic medicine.  A viral cough must run its course and will not respond to antibiotics.  Your caregiver may recommend other treatments if you have a chronic cough. HOME CARE INSTRUCTIONS   Only take over-the-counter or prescription medicines for pain, discomfort, or fever as directed by your caregiver. Use cough suppressants only as directed by your caregiver.  Use a cold steam vaporizer or humidifier in your bedroom or home to help loosen secretions.  Sleep in a semi-upright position if your cough is worse at night.  Rest as needed.  Stop smoking if you smoke. SEEK IMMEDIATE MEDICAL CARE IF:   You have pus in your sputum.  Your cough starts to worsen.  You cannot control your cough with suppressants and are losing sleep.  You begin coughing up blood.  You have difficulty breathing.  You develop pain which is getting worse or is uncontrolled with medicine.  You have a fever. MAKE SURE YOU:   Understand these instructions.  Will watch your condition.  Will get help right away if you are not doing well or get worse. Document Released: 01/20/2011 Document Revised: 10/16/2011 Document Reviewed: 01/20/2011 [employer] Patient Information 2015 [employer]. This information is not intended  to replace advice given to you by your health care provider. Make sure you discuss any questions you have with your health care provider.  

## 2015-03-29 ENCOUNTER — Other Ambulatory Visit (HOSPITAL_COMMUNITY)
Admission: RE | Admit: 2015-03-29 | Discharge: 2015-03-29 | Disposition: A | Discharge status: Home / Self Care | Payer: 59 | Source: Ambulatory Visit | Attending: Obstetrics and Gynecology | Admitting: Obstetrics and Gynecology

## 2015-03-29 ENCOUNTER — Other Ambulatory Visit: Payer: Self-pay | Admitting: Obstetrics and Gynecology

## 2015-03-29 DIAGNOSIS — Z1151 Encounter for screening for human papillomavirus (HPV): Secondary | ICD-10-CM | POA: Diagnosis present

## 2015-03-29 DIAGNOSIS — Z01411 Encounter for gynecological examination (general) (routine) with abnormal findings: Secondary | ICD-10-CM | POA: Diagnosis present

## 2015-03-31 LAB — CYTOLOGY - PAP

## 2015-05-03 ENCOUNTER — Ambulatory Visit (INDEPENDENT_AMBULATORY_CARE_PROVIDER_SITE_OTHER)
Admission: RE | Admit: 2015-05-03 | Discharge: 2015-05-03 | Disposition: A | Discharge status: Home / Self Care | Payer: 59 | Source: Ambulatory Visit | Attending: Internal Medicine | Admitting: Internal Medicine

## 2015-05-03 ENCOUNTER — Ambulatory Visit (INDEPENDENT_AMBULATORY_CARE_PROVIDER_SITE_OTHER): Payer: 59 | Admitting: Internal Medicine

## 2015-05-03 ENCOUNTER — Encounter: Payer: Self-pay | Admitting: Internal Medicine

## 2015-05-03 VITALS — BP 110/86 | HR 67 | Temp 98.3°F | Ht 63.0 in | Wt 178.0 lb

## 2015-05-03 DIAGNOSIS — M5412 Radiculopathy, cervical region: Secondary | ICD-10-CM

## 2015-05-03 DIAGNOSIS — F411 Generalized anxiety disorder: Secondary | ICD-10-CM

## 2015-05-03 DIAGNOSIS — M5030 Other cervical disc degeneration, unspecified cervical region: Secondary | ICD-10-CM | POA: Insufficient documentation

## 2015-05-03 MED ORDER — OMEPRAZOLE 20 MG PO CPDR: 20.0000 mg | DELAYED_RELEASE_CAPSULE | Freq: Every day | ORAL | Status: DC

## 2015-05-03 MED ORDER — NAPROXEN 375 MG PO TABS
375.0000 mg | ORAL_TABLET | Freq: Two times a day (BID) | ORAL | Status: DC
Start: 2015-05-03 — End: 2015-10-13

## 2015-05-03 MED ORDER — ALPRAZOLAM 0.5 MG PO TABS: 0.5000 mg | ORAL_TABLET | Freq: Two times a day (BID) | ORAL | Status: DC | PRN

## 2015-05-03 NOTE — Patient Instructions (Signed)

## 2015-05-03 NOTE — Progress Notes (Signed)
Pre visit review using our clinic review tool, if applicable. No additional management support is needed unless otherwise documented below in the visit note. 

## 2015-05-03 NOTE — Progress Notes (Signed)
Subjective:  Patient ID: Hannah Miranda, female    DOB: [birth year]  Age: 49 y.o. MRN: [id number]  CC: Neck Pain   HPI Hannah Miranda presents for the complaint of left sided lower neck pain. She was reaching into the back of her car today and felt the sudden onset of left lower sharp neck pain that radiates to the left posterior shoulder, she took a dose of advil and xanax and the pain lessened. She has had recurrent episodes of this over the last 10 years but this is the most severe pain she has experienced.  Outpatient Prescriptions Prior to Visit  Medication Sig Dispense Refill  . Azilsartan-Chlorthalidone (EDARBYCLOR) 40-12.5 MG TABS Take 1 tablet by mouth daily. 30 tablet 11  . cetirizine (ZYRTEC) 10 MG tablet Take 10 mg by mouth daily.    . mometasone (NASONEX) 50 MCG/ACT nasal spray Place 2 sprays into the nose as needed.    . montelukast (SINGULAIR) 10 MG tablet Take 10 mg by mouth as needed. SPR[place] [employer] [place]ALL    . ALPRAZolam (XANAX) 0.5 MG tablet TAKE 1 TABLET BY MOUTH TWICE A DAY AS NEEDED 90 tablet 2  . HYDROcodone-homatropine (HYCODAN) 5-1.5 MG/5ML syrup Take 5 mLs by mouth every 8 (eight) hours as needed for cough. 120 mL 0  . omeprazole (PRILOSEC) 20 MG capsule Take 20 mg by mouth daily.     No facility-administered medications prior to visit.    ROS Review of Systems  Constitutional: Negative.   HENT: Negative.   Eyes: Negative.   Respiratory: Negative.  Negative for cough, choking, chest tightness, shortness of breath and stridor.   Cardiovascular: Negative.  Negative for chest pain and leg swelling.  Gastrointestinal: Negative.  Negative for nausea, vomiting, abdominal pain, diarrhea, constipation and blood in stool.  Endocrine: Negative.   Genitourinary: Negative.   Musculoskeletal: Positive for neck pain. Negative for myalgias, back pain, arthralgias and neck stiffness.  Skin: Negative.  Negative for rash.  Allergic/Immunologic: Negative.   Neurological: Negative.   Negative for dizziness, seizures, facial asymmetry, weakness and numbness.  Hematological: Negative.  Negative for adenopathy. Does not bruise/bleed easily.  Psychiatric/Behavioral: Positive for sleep disturbance. Negative for suicidal ideas, behavioral problems, confusion, self-injury, dysphoric mood and decreased concentration. The patient is nervous/anxious.     Objective:  BP 110/86 mmHg  Pulse 67  Temp(Src) 98.3 F (36.8 C) (Oral)  Ht  (1.6 m)  Wt 178 lb (80.74 kg)  BMI 31.54 kg/m2  SpO2 97%  BP Readings from Last 3 Encounters:  05/03/15 110/86  02/23/15 112/86  01/26/15 122/84    Wt Readings from Last 3 Encounters:  05/03/15 178 lb (80.74 kg)  02/23/15 179 lb (81.194 kg)  01/26/15 175 lb (79.379 kg)    Physical Exam  Constitutional: No distress.  HENT:  Mouth/Throat: Oropharynx is clear and moist. No oropharyngeal exudate.  Eyes: Conjunctivae are normal. Right eye exhibits no discharge. Left eye exhibits no discharge. No scleral icterus.  Neck: Normal range of motion. Neck supple. No JVD present. No tracheal deviation present. No thyromegaly present.  Cardiovascular: Normal rate, regular rhythm, normal heart sounds and intact distal pulses.  Exam reveals no gallop and no friction rub.   No murmur heard. Pulmonary/Chest: Effort normal and breath sounds normal. No stridor. No respiratory distress. She has no wheezes. She has no rales. She exhibits no tenderness.  Abdominal: Soft. Bowel sounds are normal. She exhibits no distension and no mass. There is no tenderness. There is  no rebound and no guarding.  Musculoskeletal: Normal range of motion. She exhibits no edema or tenderness.       Cervical back: Normal. She exhibits normal range of motion, no tenderness, no bony tenderness, no swelling, no edema, no deformity, no laceration, no pain, no spasm and normal pulse.  Lymphadenopathy:    She has no cervical adenopathy.  Neurological: She is alert. She displays no  atrophy, no tremor and normal reflexes. No cranial nerve deficit or sensory deficit. She exhibits abnormal muscle tone. She displays a negative Romberg sign. She displays no seizure activity. Coordination and gait normal.  Reflex Scores:      Tricep reflexes are 1+ on the right side and 1+ on the left side.      Bicep reflexes are 1+ on the right side and 1+ on the left side.      Brachioradialis reflexes are 1+ on the right side and 1+ on the left side.      Patellar reflexes are 1+ on the right side and 1+ on the left side.      Achilles reflexes are 1+ on the right side and 1+ on the left side. There is mild diffuse weakness in the LUE  Skin: Skin is warm and dry. No rash noted. She is not diaphoretic. No erythema. No pallor.  Psychiatric: She has a normal mood and affect. Her behavior is normal. Judgment and thought content normal.  Vitals reviewed.   Lab Results  Component Value Date   WBC 6.8 07/24/2014   HGB 13.7 07/24/2014   HCT 40.5 07/24/2014   PLT 267.0 07/24/2014   GLUCOSE 92 07/24/2014   CHOL 181 07/24/2014   TRIG 108.0 07/24/2014   HDL 34.10* 07/24/2014   LDLCALC 125* 07/24/2014   ALT 14 07/24/2014   AST 19 07/24/2014   NA 138 07/24/2014   K 4.2 07/24/2014   CL 104 07/24/2014   CREATININE 0.7 07/24/2014   BUN 10 07/24/2014   CO2 27 07/24/2014   TSH 2.054 07/24/2014    Dg Cervical Spine Complete  05/03/2015   CLINICAL DATA:  Chronic neck and left shoulder pain, intermittent for 10 years, recent worsening and shoulder pain, no known injury.  EXAM: CERVICAL SPINE  4+ VIEWS  COMPARISON:  04/19/2007.  FINDINGS: The cervical spine is visualized from the occiput to the cervicothoracic junction. There is slight straightening of the normal cervical lordosis without subluxation or fracture. Alignment is anatomic. Endplate degenerative changes at C4-5, C5-6 and C6-7 have progressed significantly from 04/19/2007. Associated loss of disc space height at C5-6 and C6-7.  Uncovertebral hypertrophy and facet sclerosis at multiple levels as well. Mild neural foraminal narrowing bilaterally at C4-5 and possibly C5-6, left greater than right. Visualized lung apices are clear.  IMPRESSION: 1. No acute findings. 2. Progressive degenerative disc disease at C4-5, C5-6 and C6-7.   Electronically Signed   By: Leanna Battles M.D.   On: 05/03/2015 12:11    Assessment & Plan:   Hannah Miranda was seen today for neck pain.  Diagnoses and all orders for this visit:  Radiculitis of left cervical region- her plain films and exam are abnormal. I have ordered an MRI to see if there is spinal stenosis, nerve impingement, tumor, or mass. She will start a high dose nsaid. -     DG Cervical Spine Complete; Future -     MR Cervical Spine Wo Contrast; Future -     naproxen (NAPROSYN) 375 MG tablet; Take 1 tablet (375  mg total) by mouth 2 (two) times daily with a meal.  GAD (generalized anxiety disorder) -     ALPRAZolam (XANAX) 0.5 MG tablet; Take 1 tablet (0.5 mg total) by mouth 2 (two) times daily as needed.  Other orders -     omeprazole (PRILOSEC) 20 MG capsule; Take 1 capsule (20 mg total) by mouth daily.   I have discontinued Ms. Kopper's HYDROcodone-homatropine. I have also changed her omeprazole and ALPRAZolam. Additionally, I am having her start on naproxen. Lastly, I am having her maintain her cetirizine, montelukast, mometasone, and Azilsartan-Chlorthalidone.  Meds ordered this encounter  Medications  . omeprazole (PRILOSEC) 20 MG capsule    Sig: Take 1 capsule (20 mg total) by mouth daily.    Dispense:  30 capsule    Refill:  11  . ALPRAZolam (XANAX) 0.5 MG tablet    Sig: Take 1 tablet (0.5 mg total) by mouth 2 (two) times daily as needed.    Dispense:  90 tablet    Refill:  2    This request is for a new prescription for a controlled substance as required by Federal/State law..  . naproxen (NAPROSYN) 375 MG tablet    Sig: Take 1 tablet (375 mg total) by mouth 2 (two)  times daily with a meal.    Dispense:  60 tablet    Refill:  1     Follow-up: Return in about 3 weeks (around 05/24/2015).  Sanda Linger, MD

## 2015-05-04 ENCOUNTER — Encounter: Payer: Self-pay | Admitting: Internal Medicine

## 2015-05-16 ENCOUNTER — Ambulatory Visit
Admission: RE | Admit: 2015-05-16 | Discharge: 2015-05-16 | Disposition: A | Discharge status: Home / Self Care | Payer: 59 | Source: Ambulatory Visit | Attending: Internal Medicine | Admitting: Internal Medicine

## 2015-05-16 DIAGNOSIS — M5412 Radiculopathy, cervical region: Secondary | ICD-10-CM

## 2015-05-17 ENCOUNTER — Other Ambulatory Visit: Payer: Self-pay | Admitting: Internal Medicine

## 2015-05-17 ENCOUNTER — Encounter: Payer: Self-pay | Admitting: Internal Medicine

## 2015-05-17 DIAGNOSIS — E041 Nontoxic single thyroid nodule: Secondary | ICD-10-CM

## 2015-05-24 ENCOUNTER — Ambulatory Visit (INDEPENDENT_AMBULATORY_CARE_PROVIDER_SITE_OTHER): Payer: 59 | Admitting: Internal Medicine

## 2015-05-24 ENCOUNTER — Encounter: Payer: Self-pay | Admitting: Internal Medicine

## 2015-05-24 VITALS — BP 110/70 | HR 85 | Temp 98.4°F | Resp 16 | Ht 63.0 in | Wt 177.0 lb

## 2015-05-24 DIAGNOSIS — Z23 Encounter for immunization: Secondary | ICD-10-CM | POA: Diagnosis not present

## 2015-05-24 DIAGNOSIS — I10 Essential (primary) hypertension: Secondary | ICD-10-CM

## 2015-05-24 DIAGNOSIS — M5030 Other cervical disc degeneration, unspecified cervical region: Secondary | ICD-10-CM | POA: Diagnosis not present

## 2015-05-24 NOTE — Patient Instructions (Signed)
Acute Torticollis °Torticollis is a condition in which the muscles of the neck tighten (contract) abnormally, causing the neck to twist and the head to move into an unnatural position. Torticollis that develops suddenly is called acute torticollis. If torticollis becomes chronic and is left untreated, the face and neck can become deformed. °CAUSES °This condition may be caused by: °· Sleeping in an awkward position (common). °· Extending or twisting the neck muscles beyond their normal position. °· Infection. °In some cases, the cause may not be known. °SYMPTOMS °Symptoms of this condition include: °· An unnatural position of the head. °· Neck pain. °· A limited ability to move the neck. °· Twisting of the neck to one side. °DIAGNOSIS °This condition is diagnosed with a physical exam. You may also have imaging tests, such as an X-ray, CT scan, or MRI. °TREATMENT °Treatment for this condition involves trying to relax the neck muscles. It may include: °· Medicines or shots. °· Physical therapy. °· Surgery. This may be done in severe cases. °HOME CARE INSTRUCTIONS °· Take medicines only as directed by your health care provider. °· Do stretching exercises and massage your neck as directed by your health care provider. °· Keep all follow-up visits as directed by your health care provider. This is important. °SEEK MEDICAL CARE IF: °· You develop a fever. °SEEK IMMEDIATE MEDICAL CARE IF: °· You develop difficulty breathing. °· You develop noisy breathing (stridor). °· You start drooling. °· You have trouble swallowing or have pain with swallowing. °· You develop numbness or weakness in your hands or feet. °· You have changes in your speech, understanding, or vision. °· Your pain gets worse. °  °This information is not intended to replace advice given to you by your health care provider. Make sure you discuss any questions you have with your health care provider. °  °Document Released: 07/21/2000 Document Revised:  12/08/2014 Document Reviewed: 07/20/2014 °[employer]vier Interactive Patient Education ©2016 [employer] ° °

## 2015-05-24 NOTE — Progress Notes (Signed)
Pre visit review using our clinic review tool, if applicable. No additional management support is needed unless otherwise documented below in the visit note. 

## 2015-05-24 NOTE — Progress Notes (Signed)
Subjective:  Patient ID: Hannah Miranda, female    DOB: 09-09-1965  Age: 49 y.o. MRN: [id number]  CC: Neck Pain and Hypertension   HPI Hannah Miranda presents for f/up on lower neck discomfort, she had an MRI that showed mild spurring but no nerve impingement or spinal stenosis. The neck responds well to nsaids and there is no radiation of the pain and no N/W/T. The MRI did reveal an 8 mm thyroid cyst - an U/S has been ordered.  Outpatient Prescriptions Prior to Visit  Medication Sig Dispense Refill  . ALPRAZolam (XANAX) 0.5 MG tablet Take 1 tablet (0.5 mg total) by mouth 2 (two) times daily as needed. 90 tablet 2  . Azilsartan-Chlorthalidone (EDARBYCLOR) 40-12.5 MG TABS Take 1 tablet by mouth daily. 30 tablet 11  . cetirizine (ZYRTEC) 10 MG tablet Take 10 mg by mouth daily.    . mometasone (NASONEX) 50 MCG/ACT nasal spray Place 2 sprays into the nose as needed.    . montelukast (SINGULAIR) 10 MG tablet Take 10 mg by mouth as needed. SPRING & FALL    . naproxen (NAPROSYN) 375 MG tablet Take 1 tablet (375 mg total) by mouth 2 (two) times daily with a meal. 60 tablet 1  . omeprazole (PRILOSEC) 20 MG capsule Take 1 capsule (20 mg total) by mouth daily. 30 capsule 11   No facility-administered medications prior to visit.    ROS Review of Systems  Constitutional: Negative.  Negative for fever, chills, diaphoresis, appetite change and fatigue.  HENT: Negative.   Eyes: Negative.   Respiratory: Negative.  Negative for cough, choking, chest tightness, shortness of breath and stridor.   Cardiovascular: Negative.  Negative for chest pain, palpitations and leg swelling.  Gastrointestinal: Negative.  Negative for nausea, vomiting, abdominal pain, diarrhea, constipation and blood in stool.  Endocrine: Negative.   Genitourinary: Negative.   Musculoskeletal: Positive for neck pain. Negative for back pain, joint swelling, arthralgias and neck stiffness.  Skin: Negative.   Allergic/Immunologic:  Negative.   Neurological: Negative.  Negative for dizziness, tremors, weakness, light-headedness, numbness and headaches.  Hematological: Negative.   Psychiatric/Behavioral: Negative.     Objective:  BP 110/70 mmHg  Pulse 85  Temp(Src) 98.4 F (36.9 C) (Oral)  Resp 16  Ht 5\' 3"  (1.6 m)  Wt 177 lb (80.287 kg)  BMI 31.36 kg/m2  SpO2 98%  BP Readings from Last 3 Encounters:  05/24/15 110/70  05/03/15 110/86  02/23/15 112/86    Wt Readings from Last 3 Encounters:  05/24/15 177 lb (80.287 kg)  05/03/15 178 lb (80.74 kg)  02/23/15 179 lb (81.194 kg)    Physical Exam  Constitutional: She is oriented to person, place, and time. She appears well-developed and well-nourished. No distress.  HENT:  Mouth/Throat: Oropharynx is clear and moist. No oropharyngeal exudate.  Eyes: Conjunctivae are normal. Right eye exhibits no discharge. Left eye exhibits no discharge. No scleral icterus.  Neck: Normal range of motion. Neck supple. No JVD present. No tracheal deviation, no edema and no erythema present. No thyroid mass and no thyromegaly present.  Cardiovascular: Normal rate, regular rhythm, normal heart sounds and intact distal pulses.  Exam reveals no gallop and no friction rub.   No murmur heard. Pulmonary/Chest: Effort normal and breath sounds normal. No stridor. No respiratory distress. She has no wheezes. She has no rales. She exhibits no tenderness.  Abdominal: Soft. Bowel sounds are normal. She exhibits no distension and no mass. There is no tenderness. There is  no rebound and no guarding.  Musculoskeletal: Normal range of motion. She exhibits no edema or tenderness.  Lymphadenopathy:    She has no cervical adenopathy.  Neurological: She is alert and oriented to person, place, and time. She has normal reflexes. She displays normal reflexes. No cranial nerve deficit. She exhibits normal muscle tone. Coordination normal.  Skin: Skin is warm and dry. No rash noted. She is not  diaphoretic. No erythema. No pallor.  Psychiatric: She has a normal mood and affect. Her behavior is normal. Judgment and thought content normal.  Vitals reviewed.   Lab Results  Component Value Date   WBC 6.8 07/24/2014   HGB 13.7 07/24/2014   HCT 40.5 07/24/2014   PLT 267.0 07/24/2014   GLUCOSE 92 07/24/2014   CHOL 181 07/24/2014   TRIG 108.0 07/24/2014   HDL 34.10* 07/24/2014   LDLCALC 125* 07/24/2014   ALT 14 07/24/2014   AST 19 07/24/2014   NA 138 07/24/2014   K 4.2 07/24/2014   CL 104 07/24/2014   CREATININE 0.7 07/24/2014   BUN 10 07/24/2014   CO2 27 07/24/2014   TSH 2.054 07/24/2014    Mr Cervical Spine Wo Contrast  05/16/2015  CLINICAL DATA:  Radiculitis of the left cervical region. Left scapular pain. Left arm and hand weakness and numbness in the left hand. EXAM: MRI CERVICAL SPINE WITHOUT CONTRAST TECHNIQUE: Multiplanar, multisequence MR imaging of the cervical spine was performed. No intravenous contrast was administered. COMPARISON:  Cervical radiographs dated 05/03/2015 FINDINGS: The visualized intracranial contents and cervical spinal cord are normal. The patient has multiple slightly dilated nerve root sleeves including on the right and left at C6-7 and C7-T1. There is a cystic lesion just posterior to the right submandibular gland measuring 24 x 15 x 14 mm. There is a fluid fluid level in the cyst with some debris at the inferior aspect of the cyst. This is consistent with a benign second brachial cleft cyst. There are benign hemangiomata in T1 and T2. There are some dilated lymphatic channels just posterior to the inferior tip of the left lobe with only thyroid gland, nonspecific. There is an 8 mm complex nodule in the posterior aspect of the right lobe of the thyroid gland. Craniocervical junction through C3-4: Normal. C4-5: Minimal uncinate spurs to the right and left without neural impingement. C5-6: Small uncinate spurs to the right and left without neural  impingement. The spurs to the left are slightly more prominent than to the right. C6-7:  Tiny central disc bulge with no neural impingement. C7-T1:  Normal disc.  Normal facet joints. IMPRESSION: 1. No significant degenerative disc and joint disease in the cervical spine. No areas of neural impingement. 2. Small benign second branchial cleft cyst. 3. 8 mm complex nodule in the right lobe of the thyroid gland. Electronically Signed   By: Francene Boyers M.D.   On: 05/16/2015 15:52    Assessment & Plan:   Hannah Miranda was seen today for neck pain and hypertension.  Diagnoses and all orders for this visit:  Need for influenza vaccination -     Flu Vaccine QUAD 36+ mos IM  DDD (degenerative disc disease), cervical- she has responded well to nsaids, she does not want to do PT  Essential hypertension- her BP is well controlled   I am having Hannah Miranda maintain her cetirizine, montelukast, mometasone, Azilsartan-Chlorthalidone, omeprazole, ALPRAZolam, and naproxen.  No orders of the defined types were placed in this encounter.     Follow-up: Return  if symptoms worsen or fail to improve.  Scarlette Calico, MD

## 2015-05-25 ENCOUNTER — Other Ambulatory Visit: Payer: Self-pay | Admitting: Internal Medicine

## 2015-05-25 DIAGNOSIS — M5030 Other cervical disc degeneration, unspecified cervical region: Secondary | ICD-10-CM

## 2015-05-27 ENCOUNTER — Ambulatory Visit
Admission: RE | Admit: 2015-05-27 | Discharge: 2015-05-27 | Disposition: A | Discharge status: Home / Self Care | Payer: 59 | Source: Ambulatory Visit | Attending: Internal Medicine | Admitting: Internal Medicine

## 2015-05-27 ENCOUNTER — Encounter: Payer: Self-pay | Admitting: Internal Medicine

## 2015-05-27 DIAGNOSIS — E041 Nontoxic single thyroid nodule: Secondary | ICD-10-CM

## 2015-06-10 ENCOUNTER — Ambulatory Visit: Payer: 59 | Attending: Internal Medicine | Admitting: Physical Therapy

## 2015-06-10 DIAGNOSIS — M436 Torticollis: Secondary | ICD-10-CM

## 2015-06-10 DIAGNOSIS — M6281 Muscle weakness (generalized): Secondary | ICD-10-CM | POA: Diagnosis present

## 2015-06-10 DIAGNOSIS — M5030 Other cervical disc degeneration, unspecified cervical region: Secondary | ICD-10-CM | POA: Diagnosis not present

## 2015-06-10 NOTE — Patient Instructions (Signed)
Watch posture!!!  Limit sitting, sit with lumbar roll  Extensors, Supine   Lie supine, head on small, rolled towel. Gently tuck chin and bring toward chest. Hold _3__ seconds. Repeat __10_ times per session. Do _5__ sessions per day.  Copyright  VHI. All rights reserved.  Flexibility: Neck Retraction   Pull head straight back, keeping eyes and jaw level. Repeat _10___ times per set. Do ___1_ sets per session. Do _5___ sessions per day.  http://orth.exer.us/344   Copyright  VHI. All rights reserved.

## 2015-06-11 NOTE — Therapy (Signed)
[facility] [address] [place], [place], [address] Phone: [phone]   Fax:  [phone]  Physical Therapy Evaluation  Patient Details  Name: Hannah Kindredngela C Dials MRN: [id number] Date of Birth: 12-01-1965 Referring Provider: Dr. Sanda Lingerhomas Jones  Encounter Date: 06/10/2015      PT End of Session - 06/11/15 0649    Visit Number 1   Number of Visits 16   Date for PT Re-Evaluation 08/06/15   Authorization Type [employer]   PT Start Time 1545   PT Stop Time 1630   PT Time Calculation (min) 45 min   Activity Tolerance Patient tolerated treatment well      Past Medical History  Diagnosis Date  . Hypertension   . Anxiety   . GERD (gastroesophageal reflux disease)   . Depression     No past surgical history on file.  There were no vitals filed for this visit.  Visit Diagnosis:  DDD (degenerative disc disease), cervical - Plan: PT plan of care cert/re-cert  Stiffness of cervical spine - Plan: PT plan of care cert/re-cert  Muscle weakness - Plan: PT plan of care cert/re-cert      Subjective Assessment - 06/10/15 1545    Subjective 10 year hx of left scapular pain, over the last year "the disc has started to slip".  Tries to be careful with lifting. Twisting and opening jars is difficult.  Hard to stand to cut veggies/fruits.    Pertinent History B CTS   Limitations Sitting;Lifting   How long can you sit comfortably? 20 min   How long can you stand comfortably?  30 min   Diagnostic tests x-ray; MRI WNLs;  NCV   Patient Stated Goals Strengthen my back so I'm more comfortable standing and sitting;    Currently in Pain? Yes   Pain Score 3    Pain Location Neck   Pain Orientation Right;Left   Pain Descriptors / Indicators Burning   Pain Type Chronic pain   Pain Onset More than a month ago   Pain Frequency Constant   Aggravating Factors  lifting, standing prolonged; sitting   Pain Relieving Factors lying down. moving; walking             [facility] - 06/10/15 1553    Assessment   Medical Diagnosis cervical DDD   Referring Provider Dr. Sanda Lingerhomas Jones   Onset Date/Surgical Date 05/10/15   Hand Dominance Right   Next MD Visit none scheduled   Prior Therapy no previous PT   Precautions   Precautions None   Restrictions   Weight Bearing Restrictions No   Balance Screen   Has the patient fallen in the past 6 months No  fell 1 year ago onto left hip; also 9 months ago trip on mat   Has the patient had a decrease in activity level because of a fear of falling?  No   Is the patient reluctant to leave their home because of a fear of falling?  No   Home Environment   Living Environment Private residence   Type of Home House   Home Access Stairs to enter   Home Layout One level   Prior Function   Level of Independence Independent   Vocation Full time employment   Vocation Requirements computer   Leisure ride bikes, walk in park   Observation/Other Assessments   Focus on Therapeutic Outcomes (FOTO)  50% limited   AROM   Right/Left Shoulder Right;Left   Right Shoulder  Flexion 155 Degrees   Right Shoulder Internal Rotation --  WFLs but painful   Left Shoulder Flexion 155 Degrees   Left Shoulder Internal Rotation --  WFLs but painful   Cervical Flexion 60   Cervical Extension 40   Cervical - Right Side Bend 40   Cervical - Left Side Bend 35   Cervical - Right Rotation 40   Cervical - Left Rotation 40   Strength   Strength Assessment Site Shoulder;Cervical   Right/Left Shoulder Right;Left   Right Shoulder Flexion 5/5   Right Shoulder Extension 5/5   Right Shoulder ABduction 5/5   Left Shoulder Flexion 5/5   Left Shoulder Extension 4+/5   Left Shoulder ABduction 5/5   Left Shoulder Horizontal ABduction 4/5   Cervical Flexion 4/5   Cervical Extension 4/5   Palpation   Palpation comment medial border of left scapula tender points   Spurling's   Findings Negative   Distraction Test    Findngs Positive   other    Findings Negative   Side Left   Comment upper limb tension test                           PT Education - 06/11/15 0649    Education provided Yes   Education Details cervical retractions, postural correction   Person(s) Educated Patient   Methods Explanation;Demonstration;Handout   Comprehension Verbalized understanding;Returned demonstration          PT Short Term Goals - 06/11/15 1145    PT SHORT TERM GOAL #1   Title Patient will demonstrate strategies for sitting postural correction (lumbar roll, frequent stand/walk breaks)   Time 4   Period Weeks   Status New   PT SHORT TERM GOAL #2   Title Cervical extension and rotation bilaterally improved to 45 degrees needed for ADLs and driving   Time 4   Period Weeks   Status New   PT SHORT TERM GOAL #3   Title Patient will reports an improvement in pain and function at 25%   Time 4   Period Weeks   Status New           PT Long Term Goals - 06/11/15 1147    PT LONG TERM GOAL #1   Title Patient will be independent in self management through postural correction and safe self progression of HEP   Time 8   Period Weeks   Status New   PT LONG TERM GOAL #2   Title Cervical extension, sidebending and rotation improved to grossly 50 degrees needed for ADLS and driving with greater ease.   Time 8   Period Weeks   Status New   PT LONG TERM GOAL #3   Title Deep cervical flexor and extensor strength as well as periscapular muscle strength grossly 4+/5 to 5-/5.   Time 8   Period Weeks   Status New   PT LONG TERM GOAL #4   Title Patient will have an improvement in FOTO functional outcome score from 50% to 39% indicating improved function with less pain   Time 8   Period Weeks   Status New               Plan - 06/11/15 0650    Clinical Impression Statement 49 year old female with a decade long history of neck and left > right scapular pain which began for no apparent  reason.  Symptoms worse over the past 6  months.  Worse with static positions.  Works as an Airline pilot- all sitting.  Forward head, rounded shoulders.  Decreased cervical extension, sidebending and rotation ROM.  Decreased cervical and left scapular strength 4/5.  Negative Spurlings and ULTT.  Positive distraction test.  She would benefit from PT to address these deficits.    Pt will benefit from skilled therapeutic intervention in order to improve on the following deficits Pain;Decreased range of motion;Increased muscle spasms;Increased fascial restricitons;Decreased strength;Impaired flexibility   Rehab Potential Good   PT Frequency 2x / week   PT Duration 8 weeks   PT Treatment/Interventions ADLs/Self Care Home Management;Cryotherapy;Electrical Stimulation;Moist Heat;Therapeutic exercise;Patient/family education;Manual techniques;Taping;Dry needling   PT Next Visit Plan assess response to cervical retractions;  dry needling left periscapular muscles; soft tissue work; scapular mobilizations;  add scapular retractions/depressions to HEP and thoracic extension         Problem List Patient Active Problem List   Diagnosis Date Noted  . Right thyroid nodule 05/17/2015  . DDD (degenerative disc disease), cervical 05/03/2015  . GAD (generalized anxiety disorder) 05/03/2015  . Meralgia paresthetica of left side 10/21/2014  . Carpal tunnel syndrome, right 09/01/2014  . Essential hypertension 07/24/2014  . Routine general medical examination at a health care facility 07/24/2014    Vivien Presto 06/11/2015, 11:53 AM  [facility] [address] [place], [place], [address] Phone: [phone]   Fax:  [phone]  Name: ALEXIUS ELLINGTON MRN: [id number] Date of Birth: 1965/12/25   Lavinia Sharps, PT 06/11/2015 11:53 AM Phone: [phone] Fax: [phone]

## 2015-06-14 ENCOUNTER — Ambulatory Visit: Payer: 59 | Admitting: Physical Therapy

## 2015-06-14 DIAGNOSIS — M6281 Muscle weakness (generalized): Secondary | ICD-10-CM

## 2015-06-14 DIAGNOSIS — M436 Torticollis: Secondary | ICD-10-CM

## 2015-06-14 DIAGNOSIS — M5030 Other cervical disc degeneration, unspecified cervical region: Secondary | ICD-10-CM | POA: Diagnosis not present

## 2015-06-14 NOTE — Patient Instructions (Signed)
Levator Stretch   Grasp seat or sit on hand on side to be stretched. Turn head toward other side and look down. Use hand on head to gently stretch neck in that position. Hold _30___ seconds. Repeat on other side. Repeat 2-3 ____ times. Do _2-3___ sessions per day.  http://gt2.exer.us/30   Copyright  VHI. All rights reserved.  Side-Bending   One hand on opposite side of head, pull head to side as far as is comfortable. Stop if there is pain. Hold 30 ____ seconds. Repeat with other hand to other side. Repeat _2-3___ times. Do _2-3___ sessions per day.   Copyright  VHI. All rights reserved.  Scapular Retraction (Standing)   With arms at sides, pinch shoulder blades together. Repeat 10____ times per set. Do _1___ sets per session. Do _2-3___ sessions per day.  Do Chin tuck for 5 sec x 10 as shown in clinic with towel roll to support neck for deep neck flexor stretch.  Hug, Standing   Stand, hands grasping behind opposite shoulders. Reach as far around back as possible. Hold 30___ seconds. Repeat _2-3__ times per session. Do 2-3___ sessions per day. Lower Cervical / Upper Thoracic Stretch .       Trigger Point Dry Needling  . What is Trigger Point Dry Needling (DN)? o DN is a physical therapy technique used to treat muscle pain and dysfunction. Specifically, DN helps deactivate muscle trigger points (muscle knots).  o A thin filiform needle is used to penetrate the skin and stimulate the underlying trigger point. The goal is for a local twitch response (LTR) to occur and for the trigger point to relax. No medication of any kind is injected during the procedure.   . What Does Trigger Point Dry Needling Feel Like?  o The procedure feels different for each individual patient. Some patients report that they do not actually feel the needle enter the skin and overall the process is not painful. Very mild bleeding may occur. However, many patients feel a deep cramping in the muscle  in which the needle was inserted. This is the local twitch response.   Marland Kitchen. How Will I feel after the treatment? o Soreness is normal, and the onset of soreness may not occur for a few hours. Typically this soreness does not last longer than two days.  o Bruising is uncommon, however; ice can be used to decrease any possible bruising.  o In rare cases feeling tired or nauseous after the treatment is normal. In addition, your symptoms may get worse before they get better, this period will typically not last longer than 24 hours.   . What Can I do After My Treatment? o Increase your hydration by drinking more water for the next 24 hours. o You may place ice or heat on the areas treated that have become sore, however, do not use heat on inflamed or bruised areas. Heat often brings more relief post needling. o You can continue your regular activities, but vigorous activity is not recommended initially after the treatment for 24 hours. DN is best combined with other physical therapy such as strengthening, stretching, and other therapies.   Garen LahLawrie Beardsley, PT 06/14/2015 11:18 AM Phone: [phone] Fax: [phone]

## 2015-06-14 NOTE — Therapy (Signed)
[facility] [address] [place], [place], [address] Phone: [phone]   Fax:  [phone]  Physical Therapy Treatment  Patient Details  Name: Hannah Miranda MRN: [id number] Date of Birth: [birth year] Referring Provider: Dr. Sanda Linger  Encounter Date: 06/14/2015      PT End of Session - 06/14/15 1122    Visit Number 2   Number of Visits 16   Date for PT Re-Evaluation 08/06/15   Authorization Type UHC   PT Start Time 1107   PT Stop Time 1200   PT Time Calculation (min) 53 min   Activity Tolerance Patient tolerated treatment well   Behavior During Therapy [facility] for tasks assessed/performed      Past Medical History  Diagnosis Date  . Hypertension   . Anxiety   . GERD (gastroesophageal reflux disease)   . Depression     No past surgical history on file.  There were no vitals filed for this visit.  Visit Diagnosis:  DDD (degenerative disc disease), cervical  Stiffness of cervical spine  Muscle weakness      Subjective Assessment - 06/14/15 1109    Subjective I have a hot burning situation over my left scapula    Pertinent History B CTS   Limitations Sitting;Lifting   Diagnostic tests x-ray; MRI WNLs;  NCV   Patient Stated Goals Strengthen my back so I'm more comfortable standing and sitting;    Currently in Pain? Yes   Pain Score 2    Pain Location Neck   Pain Orientation Left   Pain Descriptors / Indicators Burning   Pain Type Chronic pain   Pain Radiating Towards Left scapula            [facility] PT Assessment - 06/14/15 1137    AROM   Cervical - Right Side Bend 50   Cervical - Left Side Bend 44   Cervical - Right Rotation 50   Cervical - Left Rotation 48                     [facility] Adult PT Treatment/Exercise - 06/14/15 1112    Shoulder Exercises: Standing   Other Standing Exercises rhomboid stretch 30 sec x 3 with VC   Other Standing Exercises scapular retraction  x 10 with 3 sec    Moist Heat Therapy   Number Minutes Moist Heat 15 Minutes   Moist Heat Location Cervical  upper back   Manual Therapy   Joint Mobilization Thoracic T-3 to T-9 no marked tenderness  Grade 3   Soft tissue mobilization Left upper trap, scaular muscle and levator with subocciital distraction   Neck Exercises: Stretches   Upper Trapezius Stretch 2 reps;30 seconds  left side   Upper Trapezius Stretch Limitations Pain VCTC for correct execution   Levator Stretch 2 reps;30 seconds  left side   Levator Stretch Limitations VC/TC for correct execution   Other Neck Stretches Chin tuck in supine with towel roll 10 x  5 sec hold          Trigger Point Dry Needling - 06/14/15 1111    Consent Given? Yes   Education Handout Provided Yes   Muscles Treated Upper Body Upper trapezius;Levator scapulae;Rhomboids;Subscapularis  left side only   Upper Trapezius Response Twitch reponse elicited;Palpable increased muscle length   Levator Scapulae Response Twitch response elicited;Palpable increased muscle length   Rhomboids Response Palpable increased muscle length   Subscapularis Response Palpable increased muscle length  PT Education - 06/14/15 1120    Education provided Yes   Education Details Added to HEP, upper trap, levator , rhomboid and scapular retraction,  precautians and aftercare for TDN   Person(s) Educated Patient   Methods Explanation;Demonstration;Handout;Tactile cues;Verbal cues   Comprehension Verbalized understanding;Returned demonstration          PT Short Term Goals - 06/11/15 1145    PT SHORT TERM GOAL #1   Title Patient will demonstrate strategies for sitting postural correction (lumbar roll, frequent stand/walk breaks)   Time 4   Period Weeks   Status New   PT SHORT TERM GOAL #2   Title Cervical extension and rotation bilaterally improved to 45 degrees needed for ADLs and driving   Time 4   Period Weeks   Status New   PT SHORT TERM GOAL #3    Title Patient will reports an improvement in pain and function at 25%   Time 4   Period Weeks   Status New           PT Long Term Goals - 06/11/15 1147    PT LONG TERM GOAL #1   Title Patient will be independent in self management through postural correction and safe self progression of HEP   Time 8   Period Weeks   Status New   PT LONG TERM GOAL #2   Title Cervical extension, sidebending and rotation improved to grossly 50 degrees needed for ADLS and driving with greater ease.   Time 8   Period Weeks   Status New   PT LONG TERM GOAL #3   Title Deep cervical flexor and extensor strength as well as periscapular muscle strength grossly 4+/5 to 5-/5.   Time 8   Period Weeks   Status New   PT LONG TERM GOAL #4   Title Patient will have an improvement in FOTO functional outcome score from 50% to 39% indicating improved function with less pain   Time 8   Period Weeks   Status New               Plan - 06/14/15 1125    Clinical Impression Statement Pt returns with 2/10 pain and increased AROM of Right side bend 50 left side bend 44, Right rot 50 and left rotation 48 afyter trigger point dry needling with improved motion and decreased pain 1/10   Pt will benefit from skilled therapeutic intervention in order to improve on the following deficits Pain;Decreased range of motion;Increased muscle spasms;Increased fascial restricitons;Decreased strength;Impaired flexibility   Rehab Potential Good   PT Frequency 2x / week   PT Duration 8 weeks   PT Treatment/Interventions ADLs/Self Care Home Management;Cryotherapy;Electrical Stimulation;Moist Heat;Therapeutic exercise;Patient/family education;Manual techniques;Taping;Dry needling   PT Next Visit Plan Assess Dry needling and review exercises.  Progress to supine scap stabilization as able .  add thoracic extension   PT Home Exercise Plan upper trap, rhomboid, levator stretch and chin tuck/scapular retraction.  heat   Consulted and  Agree with Plan of Care Patient        Problem List Patient Active Problem List   Diagnosis Date Noted  . Right thyroid nodule 05/17/2015  . DDD (degenerative disc disease), cervical 05/03/2015  . GAD (generalized anxiety disorder) 05/03/2015  . Meralgia paresthetica of left side 10/21/2014  . Carpal tunnel syndrome, right 09/01/2014  . Essential hypertension 07/24/2014  . Routine general medical examination at a health care facility 07/24/2014    Garen Lah, PT 06/14/2015 11:47 AM Phone:  [phone] Fax: [phone]  [facility] [address] [place], [place], [address] Phone: [phone]   Fax:  [phone]  Name: Hannah Miranda MRN: [id number] Date of Birth: 05/23/1966

## 2015-06-16 ENCOUNTER — Encounter: Payer: 59 | Admitting: Physical Therapy

## 2015-06-16 ENCOUNTER — Ambulatory Visit: Payer: 59 | Admitting: Physical Therapy

## 2015-06-16 DIAGNOSIS — M436 Torticollis: Secondary | ICD-10-CM

## 2015-06-16 DIAGNOSIS — M5030 Other cervical disc degeneration, unspecified cervical region: Secondary | ICD-10-CM

## 2015-06-16 DIAGNOSIS — M6281 Muscle weakness (generalized): Secondary | ICD-10-CM

## 2015-06-16 NOTE — Patient Instructions (Signed)
Over Head Pull: Narrow Grip       On back, knees bent, feet flat, band across thighs, elbows straight but relaxed. Pull hands apart (start). Keeping elbows straight, bring arms up and over head, hands toward floor. Keep pull steady on band. Hold momentarily. Return slowly, keeping pull steady, back to start. Repeat _5-to 10__ times. Band color _yellow_____   Side Pull: Double Arm   On back, knees bent, feet flat. Arms perpendicular to body, shoulder level, elbows straight but relaxed. Pull arms out to sides, elbows straight. Resistance band comes across collarbones, hands toward floor. Hold momentarily. Slowly return to starting position. Repeat _5 -10__ times. Band color yellow_____   Sash   On back, knees bent, feet flat, left hand on left hip, right hand above left. Pull right arm DIAGONALLY (hip to shoulder) across chest. Bring right arm along head toward floor. Hold momentarily. Slowly return to starting position. Repeat 5 to 10___ times. Do with left arm. Band color _yellow_____   Shoulder Rotation: Double Arm   On back, knees bent, feet flat, elbows tucked at sides, bent 90, hands palms up. Pull hands apart and down toward floor, keeping elbows near sides. Hold momentarily. Slowly return to starting position. Repeat _5 -10__ times. Band color yellow______

## 2015-06-16 NOTE — Therapy (Signed)
[facility] [address] [place], [place], [address] Phone: [phone]   Fax:  [phone]  Physical Therapy Treatment  Patient Details  Name: Hannah Miranda MRN: [id number] Date of Birth: 1965-09-23 Referring Provider: Dr. Sanda Linger  Encounter Date: 06/16/2015      PT End of Session - 06/16/15 1321    Visit Number 3   Number of Visits 16   Date for PT Re-Evaluation 08/06/15   PT Start Time 0733   PT Stop Time 0800   PT Time Calculation (min) 27 min   Activity Tolerance Patient tolerated treatment well;No increased pain   Behavior During Therapy [facility] for tasks assessed/performed      Past Medical History  Diagnosis Date  . Hypertension   . Anxiety   . GERD (gastroesophageal reflux disease)   . Depression     No past surgical history on file.  There were no vitals filed for this visit.  Visit Diagnosis:  DDD (degenerative disc disease), cervical  Stiffness of cervical spine  Muscle weakness      Subjective Assessment - 06/16/15 0736    Subjective 2/10 .  Burns with standing .  Lewss pain with taking offr shirts.  Turns head to see with driving better.     Currently in Pain? Yes   Pain Score 2    Pain Location Neck   Pain Orientation Left   Pain Descriptors / Indicators Burning   Pain Radiating Towards Lt scapula   Pain Frequency Constant   Aggravating Factors  long standing 30 minutes   Pain Relieving Factors lying down                          [facility] Adult PT Treatment/Exercise - 06/16/15 0735    Shoulder Exercises: Supine   Other Supine Exercises scapular stabilization series issued.  These were challanging and she was up to giving them a try.  yellow band issued.     Neck Exercises: Stretches   Upper Trapezius Stretch 1 rep  15 seconds   Levator Stretch 1 rep;20 seconds   Neck Stretch 2 reps;10 seconds  fist for chin support an more relaxed stretch with overpress   Other Neck  Stretches Seated trunk rotation.                 PT Education - 06/16/15 1320    Education provided Yes   Education Details supins scapular stabilization   Person(s) Educated Patient   Methods Explanation;Handout;Demonstration;Tactile cues;Verbal cues   Comprehension Verbalized understanding;Returned demonstration          PT Short Term Goals - 06/16/15 1322    PT SHORT TERM GOAL #1   Title Patient will demonstrate strategies for sitting postural correction (lumbar roll, frequent stand/walk breaks)   Time 4   Period Weeks   Status On-going   PT SHORT TERM GOAL #2   Title Cervical extension and rotation bilaterally improved to 45 degrees needed for ADLs and driving   Time 4   Period Weeks   Status On-going   PT SHORT TERM GOAL #3   Title Patient will reports an improvement in pain and function at 25%   Time 4   Period Weeks   Status On-going           PT Long Term Goals - 06/11/15 1147    PT LONG TERM GOAL #1   Title Patient will be independent in self management through  postural correction and safe self progression of HEP   Time 8   Period Weeks   Status New   PT LONG TERM GOAL #2   Title Cervical extension, sidebending and rotation improved to grossly 50 degrees needed for ADLS and driving with greater ease.   Time 8   Period Weeks   Status New   PT LONG TERM GOAL #3   Title Deep cervical flexor and extensor strength as well as periscapular muscle strength grossly 4+/5 to 5-/5.   Time 8   Period Weeks   Status New   PT LONG TERM GOAL #4   Title Patient will have an improvement in FOTO functional outcome score from 50% to 39% indicating improved function with less pain   Time 8   Period Weeks   Status New               Problem List Patient Active Problem List   Diagnosis Date Noted  . Right thyroid nodule 05/17/2015  . DDD (degenerative disc disease), cervical 05/03/2015  . GAD (generalized anxiety disorder) 05/03/2015  . Meralgia  paresthetica of left side 10/21/2014  . Carpal tunnel syndrome, right 09/01/2014  . Essential hypertension 07/24/2014  . Routine general medical examination at a health care facility 07/24/2014    [facility], [facility] FranciscoARRIS,Jamielee Mchale 06/16/2015, 1:23 PM  [facility] [address] [place], [place], [address] Phone: [phone]   Fax:  [phone]  Name: Hannah Miranda MRN: [id number] Date of Birth: 1966/05/29    Liz BeachKaren Adithya Difrancesco, PTA 06/16/2015 1:23 PM Phone: [phone] Fax: [phone]

## 2015-06-23 ENCOUNTER — Encounter: Payer: 59 | Admitting: Physical Therapy

## 2015-06-25 ENCOUNTER — Encounter: Payer: 59 | Admitting: Physical Therapy

## 2015-06-28 ENCOUNTER — Ambulatory Visit: Payer: 59 | Admitting: Physical Therapy

## 2015-06-28 DIAGNOSIS — M436 Torticollis: Secondary | ICD-10-CM

## 2015-06-28 DIAGNOSIS — M6281 Muscle weakness (generalized): Secondary | ICD-10-CM

## 2015-06-28 DIAGNOSIS — M5030 Other cervical disc degeneration, unspecified cervical region: Secondary | ICD-10-CM

## 2015-06-28 NOTE — Therapy (Signed)
[facility] [address] [place], [place], [address] Phone: [phone]   Fax:  [phone]  Physical Therapy Treatment  Patient Details  Name: Hannah Miranda MRN: [id number] Date of Birth: 18-Sep-1965 Referring Provider: Dr. Sanda Linger  Encounter Date: 06/28/2015      PT End of Session - 06/28/15 1430    Visit Number 4   Number of Visits 16   PT Start Time 1415   PT Stop Time 1500   PT Time Calculation (min) 45 min      Past Medical History  Diagnosis Date  . Hypertension   . Anxiety   . GERD (gastroesophageal reflux disease)   . Depression     No past surgical history on file.  There were no vitals filed for this visit.  Visit Diagnosis:  DDD (degenerative disc disease), cervical  Stiffness of cervical spine  Muscle weakness      Subjective Assessment - 06/28/15 1423    Subjective 2/10 pain in the neck. Sitting a lot aggravates it   Pain Score 2                          [facility] Adult PT Treatment/Exercise - 06/28/15 1643    Lumbar Exercises: Aerobic   UBE (Upper Arm Bike) L1 4 minutes forward 4 minutes backwards   Shoulder Exercises: Supine   Other Supine Exercises scapular stabilization series (from HEP) 2 sets of 10 reps each    Other Supine Exercises serratus punches 2 sets of 10 reps bilaterally with 2# weight;UE circles with cervical stabilzation 20 clockwise 20 counterclockwise   Manual Therapy   Manual Therapy Soft tissue mobilization   Manual therapy comments suboccipital release and active stretching of scalenes and upper traps x 10 minutes   Soft tissue mobilization Left upper trap, scaular muscle and levator with subocciital distraction                  PT Short Term Goals - 06/16/15 1322    PT SHORT TERM GOAL #1   Title Patient will demonstrate strategies for sitting postural correction (lumbar roll, frequent stand/walk breaks)   Time 4   Period Weeks   Status  On-going   PT SHORT TERM GOAL #2   Title Cervical extension and rotation bilaterally improved to 45 degrees needed for ADLs and driving   Time 4   Period Weeks   Status On-going   PT SHORT TERM GOAL #3   Title Patient will reports an improvement in pain and function at 25%   Time 4   Period Weeks   Status On-going           PT Long Term Goals - 06/11/15 1147    PT LONG TERM GOAL #1   Title Patient will be independent in self management through postural correction and safe self progression of HEP   Time 8   Period Weeks   Status New   PT LONG TERM GOAL #2   Title Cervical extension, sidebending and rotation improved to grossly 50 degrees needed for ADLS and driving with greater ease.   Time 8   Period Weeks   Status New   PT LONG TERM GOAL #3   Title Deep cervical flexor and extensor strength as well as periscapular muscle strength grossly 4+/5 to 5-/5.   Time 8   Period Weeks   Status New   PT LONG TERM GOAL #4   Title  Patient will have an improvement in FOTO functional outcome score from 50% to 39% indicating improved function with less pain   Time 8   Period Weeks   Status New               Plan - 06/28/15 1631    Clinical Impression Statement Pt arrives with 2/10 pain in the cervical region. Tx focused on scapular stabilization and ensuring that initial HEP was being performed with proper form. Patient still requires verbal cues for form during exercises especially PNF and ER/IR. Patient is still tight through the upper traps and cervical region so suboccipital release and active stretching   PT Next Visit Plan Progress supine scap stabilization as able.  add thoracic extension, STM ROM        Problem List Patient Active Problem List   Diagnosis Date Noted  . Right thyroid nodule 05/17/2015  . DDD (degenerative disc disease), cervical 05/03/2015  . GAD (generalized anxiety disorder) 05/03/2015  . Meralgia paresthetica of left side 10/21/2014  . Carpal  tunnel syndrome, right 09/01/2014  . Essential hypertension 07/24/2014  . Routine general medical examination at a health care facility 07/24/2014   Kenney HousemanWesley Wilberta Dorvil, VirginiaPTA 06/28/2015 4:54 PM [phone]  [facility] [address] [place], [place], [address] Phone: [phone]   Fax:  [phone]  Name: Hannah Miranda MRN: [id number] Date of Birth: [birth year]

## 2015-06-29 ENCOUNTER — Ambulatory Visit: Payer: 59 | Admitting: Physical Therapy

## 2015-06-29 DIAGNOSIS — M5030 Other cervical disc degeneration, unspecified cervical region: Secondary | ICD-10-CM | POA: Diagnosis not present

## 2015-06-29 DIAGNOSIS — M436 Torticollis: Secondary | ICD-10-CM

## 2015-06-29 DIAGNOSIS — M6281 Muscle weakness (generalized): Secondary | ICD-10-CM

## 2015-06-29 NOTE — Patient Instructions (Signed)
Handout provided for prone UE lift with head lift 10x and UE HABD with head lift 10x

## 2015-06-29 NOTE — Therapy (Signed)
[facility] [address] [place], [place], [address] Phone: [phone]   Fax:  [phone]  Physical Therapy Treatment  Patient Details  Name: Hannah Miranda MRN: [id number] Date of Birth: 1965/10/08 Referring Provider: Dr. Sanda Lingerhomas Jones  Encounter Date: 06/29/2015      PT End of Session - 06/29/15 0926    Visit Number 5   Number of Visits 16   Date for PT Re-Evaluation 08/06/15   Authorization Type UHC   PT Start Time 0800   PT Stop Time 0855   PT Time Calculation (min) 55 min      Past Medical History  Diagnosis Date  . Hypertension   . Anxiety   . GERD (gastroesophageal reflux disease)   . Depression     No past surgical history on file.  There were no vitals filed for this visit.  Visit Diagnosis:  DDD (degenerative disc disease), cervical  Stiffness of cervical spine  Muscle weakness      Subjective Assessment - 06/29/15 0800    Subjective States she is feeling better.  Felt better after dry needling.  Able to turn her head better.  Soreness from exercises from visit yesterday.  Still a little fearful of reaching overhead.     Currently in Pain? Yes   Pain Score 1    Pain Location Scapula   Pain Orientation Left;Right   Pain Type Chronic pain   Pain Frequency Constant   Aggravating Factors  sitting or standing too long                         [facility] Adult PT Treatment/Exercise - 06/29/15 0812    Shoulder Exercises: Prone   Extension Strengthening;Both;10 reps   Horizontal ABduction 1 Strengthening;Both;10 reps  with head lift   Shoulder Exercises: Standing   Other Standing Exercises ball roll up wall 10x   Moist Heat Therapy   Number Minutes Moist Heat 15 Minutes   Moist Heat Location Cervical   Manual Therapy   Manual Therapy Joint mobilization;Scapular mobilization   Joint Mobilization C4-C7 PA grade 3; lateral glides 10x grade 3 R/L   Soft tissue mobilization Left upper  trap, scaular muscle and levator with subocciital distraction   Scapular Mobilization R/Lmedial/lateral glides and distraction           Trigger Point Dry Needling - 06/29/15 0923    Consent Given? Yes   Muscles Treated Upper Body Suboccipitals muscle group  B cervical multifidi   Upper Trapezius Response Twitch reponse elicited;Palpable increased muscle length   SubOccipitals Response Palpable increased muscle length   Levator Scapulae Response Twitch response elicited;Palpable increased muscle length   Rhomboids Response Palpable increased muscle length   Subscapularis Response Palpable increased muscle length      Performed bilaterally        PT Education - 06/29/15 0925    Education provided Yes   Education Details prone scapular stabilization   Person(s) Educated Patient   Methods Explanation;Demonstration;Handout   Comprehension Verbalized understanding;Returned demonstration          PT Short Term Goals - 06/29/15 1249    PT SHORT TERM GOAL #1   Title Patient will demonstrate strategies for sitting postural correction (lumbar roll, frequent stand/walk breaks)   Status Achieved   PT SHORT TERM GOAL #2   Title Cervical extension and rotation bilaterally improved to 45 degrees needed for ADLs and driving   Time 4   Period  Weeks   Status On-going   PT SHORT TERM GOAL #3   Title Patient will reports an improvement in pain and function at 25%   Time 4   Period Weeks   Status On-going           PT Long Term Goals - 06/29/15 1250    PT LONG TERM GOAL #1   Title Patient will be independent in self management through postural correction and safe self progression of HEP   Time 8   Period Weeks   Status On-going   PT LONG TERM GOAL #2   Title Cervical extension, sidebending and rotation improved to grossly 50 degrees needed for ADLS and driving with greater ease.   Time 8   Period Weeks   Status On-going   PT LONG TERM GOAL #3   Title Deep cervical  flexor and extensor strength as well as periscapular muscle strength grossly 4+/5 to 5-/5.   Time 8   Status On-going   PT LONG TERM GOAL #4   Title Patient will have an improvement in FOTO functional outcome score from 50% to 39% indicating improved function with less pain   Time 8   Period Weeks   Status On-going               Plan - 06/29/15 1242    Clinical Impression Statement The patient reports improving cervical AROM and pain reduction.  She states the first session of dry needling made a big difference.  Myofascial tender points in bilateral subscapularis, rhomboids, upper traps and suboccipitals.  Improving muscle lengths and scapular mobility.  Therapist closely monitoring response throughout treatment session.  No exacerbation of pain with postural strengthening.     PT Next Visit Plan  assess progress toward goals next visit;  recheck cervical AROM;  assess response to prone scapular strengthening;  progress UE overhead reach exercise;  assess response to dry needling #2;  heat as needed        Problem List Patient Active Problem List   Diagnosis Date Noted  . Right thyroid nodule 05/17/2015  . DDD (degenerative disc disease), cervical 05/03/2015  . GAD (generalized anxiety disorder) 05/03/2015  . Meralgia paresthetica of left side 10/21/2014  . Carpal tunnel syndrome, right 09/01/2014  . Essential hypertension 07/24/2014  . Routine general medical examination at a health care facility 07/24/2014    Vivien Presto 06/29/2015, 12:52 PM  [facility] [address] [place], [place], [address] Phone: [phone]   Fax:  [phone]  Name: Hannah Miranda MRN: [id number] Date of Birth: Feb 17, 1966  Lavinia Sharps, PT 06/29/2015 12:52 PM Phone: [phone] Fax: [phone]

## 2015-07-06 ENCOUNTER — Ambulatory Visit: Payer: 59 | Admitting: Physical Therapy

## 2015-07-06 DIAGNOSIS — M5030 Other cervical disc degeneration, unspecified cervical region: Secondary | ICD-10-CM | POA: Diagnosis not present

## 2015-07-06 DIAGNOSIS — M6281 Muscle weakness (generalized): Secondary | ICD-10-CM

## 2015-07-06 DIAGNOSIS — M436 Torticollis: Secondary | ICD-10-CM

## 2015-07-06 NOTE — Patient Instructions (Signed)
Cervical towel roll inside pillow case-  Just above the shoulders   Pool noodle or foam roll--Lie down on it head to tailbone   [employer]vin[employer]itorPTP  Company for therapy supplies

## 2015-07-06 NOTE — Therapy (Signed)
[place], [place], [address] Phone: [phone]   Fax:  [phone]  Physical Therapy Treatment  Patient Details  Name: Hannah Miranda MRN: [id number] Date of Birth: May 03, 1966 Referring Provider: Dr. Scarlette Calico  Encounter Date: 07/06/2015      PT End of Session - 07/06/15 0955    Visit Number 6   Number of Visits 16   Date for PT Re-Evaluation 08/06/15   Authorization Type UHC   PT Start Time 0800   PT Stop Time 0855   PT Time Calculation (min) 55 min   Activity Tolerance Patient tolerated treatment well      Past Medical History  Diagnosis Date  . Hypertension   . Anxiety   . GERD (gastroesophageal reflux disease)   . Depression     No past surgical history on file.  There were no vitals filed for this visit.  Visit Diagnosis:  DDD (degenerative disc disease), cervical  Stiffness of cervical spine  Muscle weakness      Subjective Assessment - 07/06/15 0807    Subjective Denies excessive soreness after last time.  Sore x1 day after dry needling but I felt the best I had in a long time.  It was so much looser.  I didn't mind the soreness.     Currently in Pain? Yes   Pain Score 1    Pain Location Scapula   Pain Orientation Left   Pain Type Chronic pain   Pain Onset More than a month ago   Pain Frequency Constant   Aggravating Factors  careful about reaching overhead; 20 min limit on standing            [facility] PT Assessment - 07/06/15 0821    AROM   Right Shoulder Flexion 173 Degrees   Left Shoulder Flexion 173 Degrees   Cervical Flexion 60   Cervical Extension 50   Cervical - Right Side Bend 50   Cervical - Left Side Bend 55   Cervical - Right Rotation 48   Cervical - Left Rotation 48                     [facility] Adult PT Treatment/Exercise - 07/06/15 0001    Shoulder Exercises: Supine   Other Supine Exercises supine over foam roll with small UE/LE  movements    Other Supine Exercises review of supine and prone scapula stabilization exs   Shoulder Exercises: Standing   Other Standing Exercises UE Ranger on floor, wall L7 and 12 20x each   Moist Heat Therapy   Number Minutes Moist Heat 10 Minutes   Moist Heat Location Cervical  thoracic   Manual Therapy   Soft tissue mobilization Left lats, teres major and minor, infraspinatus, rhomoids, upper traps   Scapular Mobilization R/Lmedial/lateral glides and distraction           Trigger Point Dry Needling - 07/06/15 0951    Consent Given? Yes   Muscles Treated Upper Body Upper trapezius;Rhomboids;Infraspinatus;Subscapularis   Muscles Treated Lower Body --  Left latissimus dorsi, teres major   Upper Trapezius Response Palpable increased muscle length   Rhomboids Response Palpable increased muscle length   Infraspinatus Response Palpable increased muscle length   Subscapularis Response Palpable increased muscle length      Left side only        PT Education - 07/06/15 0953    Education provided Yes   Education Details cervical roll, foam roll stretch  per patient instructions   Person(s) Educated Patient   Methods Explanation;Demonstration;Handout   Comprehension Verbalized understanding;Returned demonstration          PT Short Term Goals - 07/06/15 1000    PT SHORT TERM GOAL #1   Title Patient will demonstrate strategies for sitting postural correction (lumbar roll, frequent stand/walk breaks)   Status Achieved   PT SHORT TERM GOAL #2   Title Cervical extension and rotation bilaterally improved to 45 degrees needed for ADLs and driving   Status Achieved   PT SHORT TERM GOAL #3   Title Patient will reports an improvement in pain and function at 25%   Status Achieved           PT Long Term Goals - 07/06/15 1000    PT LONG TERM GOAL #1   Title Patient will be independent in self management through postural correction and safe self progression of HEP  08/06/15    Time 8   Period Weeks   Status On-going   PT LONG TERM GOAL #2   Title Cervical extension, sidebending and rotation improved to grossly 50 degrees needed for ADLS and driving with greater ease.   Time 8   Period Weeks   Status Partially Met   PT LONG TERM GOAL #3   Title Deep cervical flexor and extensor strength as well as periscapular muscle strength grossly 4+/5 to 5-/5.   Time 8   Period Weeks   Status On-going   PT LONG TERM GOAL #4   Title Patient will have an improvement in FOTO functional outcome score from 50% to 39% indicating improved function with less pain   Time 8   Period Weeks   Status On-going               Plan - 07/06/15 0955    Clinical Impression Statement Patient reports an overall improvement of 40%.  She reports the left scapula pain is much less but still present.  Her shoulder overhead is much improved to 173 degrees without pain.  Her cervical extension and sidebending is much improved.  She is still limited by left interscapular pain with standing 20 min.  Progressing with rehab goals.  Reports good relief from dry needling 3x.     PT Next Visit Plan dry needling left interscapular muscles; add quadruped UE/LE strengthening; foam roll; thoracic extension        Problem List Patient Active Problem List   Diagnosis Date Noted  . Right thyroid nodule 05/17/2015  . DDD (degenerative disc disease), cervical 05/03/2015  . GAD (generalized anxiety disorder) 05/03/2015  . Meralgia paresthetica of left side 10/21/2014  . Carpal tunnel syndrome, right 09/01/2014  . Essential hypertension 07/24/2014  . Routine general medical examination at a health care facility 07/24/2014    [name] 07/06/2015, 10:03 AM  [place], [place], [address] Phone: [phone]   Fax:  [phone]  Name: Hannah Miranda MRN: [id number] Date of Birth: 1966-07-31   Ruben Im,  PT 07/06/2015 10:04 AM Phone: [phone] Fax: [phone]

## 2015-07-08 ENCOUNTER — Ambulatory Visit: Payer: 59 | Attending: Internal Medicine | Admitting: Physical Therapy

## 2015-07-08 DIAGNOSIS — M5030 Other cervical disc degeneration, unspecified cervical region: Secondary | ICD-10-CM | POA: Diagnosis not present

## 2015-07-08 DIAGNOSIS — M436 Torticollis: Secondary | ICD-10-CM

## 2015-07-08 DIAGNOSIS — M6281 Muscle weakness (generalized): Secondary | ICD-10-CM | POA: Diagnosis present

## 2015-07-08 NOTE — Therapy (Signed)
[place], [place], [address] Phone: [phone]   Fax:  [phone]  Physical Therapy Treatment  Patient Details  Name: Hannah Miranda MRN: [id number] Date of Birth: 1966/07/29 Referring Provider: Dr. Scarlette Calico  Encounter Date: 07/08/2015      PT End of Session - 07/08/15 0838    Visit Number 7   Number of Visits 16   Date for PT Re-Evaluation 08/06/15   Authorization Type UHC   PT Start Time 0800   PT Stop Time 0848   PT Time Calculation (min) 48 min   Activity Tolerance Patient tolerated treatment well      Past Medical History  Diagnosis Date  . Hypertension   . Anxiety   . GERD (gastroesophageal reflux disease)   . Depression     No past surgical history on file.  There were no vitals filed for this visit.  Visit Diagnosis:  DDD (degenerative disc disease), cervical  Stiffness of cervical spine  Muscle weakness      Subjective Assessment - 07/08/15 0805    Subjective Doing well.  After every treatment, my motion gets better and better.  I reached overhead this morning without difficulty this morning.   Feeling much better.  I felt a knot last night  below my shoulder blade.     Currently in Pain? No/denies   Pain Score 0-No pain   Pain Orientation Left   Pain Type Chronic pain   Aggravating Factors  stress                         [facility] Adult PT Treatment/Exercise - 07/08/15 0808    Lumbar Exercises: Aerobic   Stationary Bike Nu-Step L4 5 min   Lumbar Exercises: Seated   Other Seated Lumbar Exercises thoracic extension over ball 10x   Lumbar Exercises: Quadruped   Single Arm Raise Right;Left;5 reps   Straight Leg Raise 5 reps   Opposite Arm/Leg Raise Right arm/Left leg;Left arm/Right leg;5 reps   Moist Heat Therapy   Number Minutes Moist Heat 10 Minutes   Moist Heat Location Cervical  thoracic   Manual Therapy   Joint Mobilization Thoracic seated  distraction grade 4 6x   Soft tissue mobilization Left lats, teres major and minor, infraspinatus, rhomoids, upper traps   Scapular Mobilization R/Lmedial/lateral glides and distraction           Trigger Point Dry Needling - 07/08/15 0837    Consent Given? Yes   Levator Scapulae Response Twitch response elicited;Palpable increased muscle length   Rhomboids Response Twitch response elicited;Palpable increased muscle length   Infraspinatus Response Palpable increased muscle length   Subscapularis Response Palpable increased muscle length       Right side only.         PT Short Term Goals - 07/08/15 8756    PT SHORT TERM GOAL #1   Title Patient will demonstrate strategies for sitting postural correction (lumbar roll, frequent stand/walk breaks)   Status Achieved   PT SHORT TERM GOAL #2   Title Cervical extension and rotation bilaterally improved to 45 degrees needed for ADLs and driving   Status Achieved   PT SHORT TERM GOAL #3   Title Patient will reports an improvement in pain and function at 25%   Status Achieved           PT Long Term Goals - 07/08/15 0842    PT LONG TERM GOAL #1  Title Patient will be independent in self management through postural correction and safe self progression of HEP  08/06/15   Time 8   Period Weeks   Status On-going   PT LONG TERM GOAL #2   Title Cervical extension, sidebending and rotation improved to grossly 50 degrees needed for ADLS and driving with greater ease.   Time 8   Period Weeks   Status Partially Met   PT LONG TERM GOAL #3   Title Deep cervical flexor and extensor strength as well as periscapular muscle strength grossly 4+/5 to 5-/5.   Time 8   Period Weeks   Status On-going   PT LONG TERM GOAL #4   Title Patient will have an improvement in FOTO functional outcome score from 50% to 39% indicating improved function with less pain   Time 8   Period Weeks   Status On-going               Plan - 07/08/15  7096    Clinical Impression Statement The patient has decreased pain intensity and improving soft tissue length.  Decreased tender point size and number.  Functional improvements as well with overhead reaching.  Therapist closely monitoring response throughout treatment session.     PT Next Visit Plan dry needling left interscapular muscles; scapular strengthening as needed;   thoracic extension        Problem List Patient Active Problem List   Diagnosis Date Noted  . Right thyroid nodule 05/17/2015  . DDD (degenerative disc disease), cervical 05/03/2015  . GAD (generalized anxiety disorder) 05/03/2015  . Meralgia paresthetica of left side 10/21/2014  . Carpal tunnel syndrome, right 09/01/2014  . Essential hypertension 07/24/2014  . Routine general medical examination at a health care facility 07/24/2014    [name] 07/08/2015, 8:43 AM  [place], [place], [address] Phone: [phone]   Fax:  [phone]  Name: Hannah Miranda MRN: [id number] Date of Birth: 27-Sep-1965   Ruben Im, PT 07/08/2015 8:44 AM Phone: [phone] Fax: [phone]

## 2015-07-13 ENCOUNTER — Ambulatory Visit: Payer: 59 | Admitting: Physical Therapy

## 2015-07-13 DIAGNOSIS — M6281 Muscle weakness (generalized): Secondary | ICD-10-CM

## 2015-07-13 DIAGNOSIS — M5030 Other cervical disc degeneration, unspecified cervical region: Secondary | ICD-10-CM

## 2015-07-13 DIAGNOSIS — M436 Torticollis: Secondary | ICD-10-CM

## 2015-07-13 NOTE — Patient Instructions (Signed)
Gym:  Row and lat pull downs, Low intensity high reps  [email or web address]

## 2015-07-13 NOTE — Therapy (Signed)
[place], [place], [address] Phone: [phone]   Fax:  [phone]  Physical Therapy Treatment/Discharge Summary  Patient Details  Name: Hannah Miranda MRN: [id number] Date of Birth: December 02, 1965 Referring Provider: Dr. Scarlette Calico  Encounter Date: 07/13/2015      PT End of Session - 07/13/15 0811    Visit Number 8   Number of Visits 16   Date for PT Re-Evaluation 08/06/15   Authorization Type UHC   PT Start Time 0800   PT Stop Time 0845   PT Time Calculation (min) 45 min   Activity Tolerance Patient tolerated treatment well      Past Medical History  Diagnosis Date  . Hypertension   . Anxiety   . GERD (gastroesophageal reflux disease)   . Depression     No past surgical history on file.  There were no vitals filed for this visit.  Visit Diagnosis:  DDD (degenerative disc disease), cervical  Stiffness of cervical spine  Muscle weakness      Subjective Assessment - 07/13/15 0802    Subjective I never thought my range would be this good!  I'm doing great.  I didn't feel the knots this past weekend.     Currently in Pain? No/denies   Pain Score 0-No pain            [facility] PT Assessment - 07/13/15 0811    Observation/Other Assessments   Focus on Therapeutic Outcomes (FOTO)  35% limited   AROM   Right Shoulder Flexion 173 Degrees   Left Shoulder Flexion 173 Degrees   Cervical Flexion 60   Cervical Extension 64   Cervical - Right Side Bend 46   Cervical - Left Side Bend 50   Cervical - Right Rotation 43   Cervical - Left Rotation 46   Strength   Left Shoulder Extension 5/5   Left Shoulder Horizontal ABduction 5/5   Cervical Flexion 4+/5   Cervical Extension 4+/5                     [facility] Adult PT Treatment/Exercise - 07/13/15 0805    Lumbar Exercises: Aerobic   Stationary Bike Nu-Step L4 19mn   Shoulder Exercises: Supine   Other Supine Exercises supine DCF  10x   Shoulder Exercises: Prone   Other Prone Exercises head lift UE ext and HABD 5x each                PT Education - 07/13/15 0839    Education provided Yes   Education Details Safe self progression of HEP and gym program   Person(s) Educated Patient   Methods Explanation;Demonstration;Handout   Comprehension Verbalized understanding;Returned demonstration          PT Short Term Goals - 07/13/15 0810    PT SHORT TERM GOAL #1   Title Patient will demonstrate strategies for sitting postural correction (lumbar roll, frequent stand/walk breaks)   Status Achieved   PT SHORT TERM GOAL #2   Title Cervical extension and rotation bilaterally improved to 45 degrees needed for ADLs and driving   Status Achieved   PT SHORT TERM GOAL #3   Title Patient will reports an improvement in pain and function at 25%   Status Achieved           PT Long Term Goals - 07/13/15 0810    PT LONG TERM GOAL #1   Title Patient will be independent in self management through  postural correction and safe self progression of HEP  08/06/15   Status Achieved   PT LONG TERM GOAL #2   Title Cervical extension, sidebending and rotation improved to grossly 50 degrees needed for ADLS and driving with greater ease.   Status Partially Met   PT LONG TERM GOAL #3   Title Deep cervical flexor and extensor strength as well as periscapular muscle strength grossly 4+/5 to 5-/5.   Status Achieved   PT LONG TERM GOAL #4   Title Patient will have an improvement in FOTO functional outcome score from 50% to 39% indicating improved function with less pain   Status Achieved               Plan - 07/13/15 0840    Clinical Impression Statement The patient has met the majority of rehab goals and expresses readiness for discharge to independent HEP and gym program.  She reports excellent relief from a combination of dry needling, manual therapy and exercise.  Her neck and shoulder AROM is much improved and  painless.  Discharge from PT at this time.         PHYSICAL THERAPY DISCHARGE SUMMARY  Visits from Start of Care: 8  Current functional level related to goals / functional outcomes: See clinical impression statement above  Remaining deficits: None noted   Education / Equipment: Comprehensive HEP  Plan: Patient agrees to discharge.  Patient goals were met. Patient is being discharged due to meeting the stated rehab goals.  ?????        Problem List Patient Active Problem List   Diagnosis Date Noted  . Right thyroid nodule 05/17/2015  . DDD (degenerative disc disease), cervical 05/03/2015  . GAD (generalized anxiety disorder) 05/03/2015  . Meralgia paresthetica of left side 10/21/2014  . Carpal tunnel syndrome, right 09/01/2014  . Essential hypertension 07/24/2014  . Routine general medical examination at a health care facility 07/24/2014   Ruben Im, PT 07/13/2015 8:43 AM Phone: [phone] Fax: [phone] [name] 07/13/2015, 8:42 AM  [place], [place], [address] Phone: [phone]   Fax:  [phone]  Name: Hannah Miranda MRN: [id number] Date of Birth: 12-20-1965

## 2015-07-15 ENCOUNTER — Encounter: Payer: 59 | Admitting: Physical Therapy

## 2015-07-31 ENCOUNTER — Other Ambulatory Visit: Payer: Self-pay | Admitting: Internal Medicine

## 2015-09-17 ENCOUNTER — Telehealth: Payer: Self-pay | Admitting: *Deleted

## 2015-09-17 DIAGNOSIS — I10 Essential (primary) hypertension: Secondary | ICD-10-CM

## 2015-09-17 NOTE — Telephone Encounter (Signed)
Pt left msg triage stating her insurance will no longer cover the Edarbyclor for her BP. Requesting md to rx something else...Raechel Chute

## 2015-09-19 MED ORDER — VALSARTAN-HYDROCHLOROTHIAZIDE 160-12.5 MG PO TABS: 1.0000 | ORAL_TABLET | Freq: Every day | ORAL | Status: DC

## 2015-09-19 NOTE — Telephone Encounter (Signed)
changed

## 2015-09-20 NOTE — Telephone Encounter (Signed)
Called pt no answer LMOM md change BP med sent Diovan/HCT into [facility].../lmb

## 2015-10-08 ENCOUNTER — Encounter: Payer: Self-pay | Admitting: Internal Medicine

## 2015-10-08 NOTE — Telephone Encounter (Signed)
Offered pt slot on Saturday clinic after one. Pt denied she would like to see Dr. Yetta BarreJones. Symptoms started when she started on her new bp meds she is going to stop med until her appt with Dr. Yetta BarreJones on 3/8 I did offer first available with another provider but she would like to see Dr. Yetta BarreJones. Pt made aware if she experiences any sob chest tightening or chills to go to ED or urgent care over the weekend or until her appt date arrives. She will also monitor bp at home

## 2015-10-13 ENCOUNTER — Ambulatory Visit (INDEPENDENT_AMBULATORY_CARE_PROVIDER_SITE_OTHER): Payer: Managed Care, Other (non HMO) | Admitting: Internal Medicine

## 2015-10-13 ENCOUNTER — Encounter: Payer: Self-pay | Admitting: Internal Medicine

## 2015-10-13 VITALS — BP 110/78 | HR 72 | Temp 97.9°F | Resp 16 | Ht 63.0 in | Wt 174.0 lb

## 2015-10-13 DIAGNOSIS — I10 Essential (primary) hypertension: Secondary | ICD-10-CM | POA: Diagnosis not present

## 2015-10-13 DIAGNOSIS — J301 Allergic rhinitis due to pollen: Secondary | ICD-10-CM | POA: Diagnosis not present

## 2015-10-13 DIAGNOSIS — M5030 Other cervical disc degeneration, unspecified cervical region: Secondary | ICD-10-CM

## 2015-10-13 DIAGNOSIS — M5412 Radiculopathy, cervical region: Secondary | ICD-10-CM

## 2015-10-13 NOTE — Progress Notes (Signed)
Pre visit review using our clinic review tool, if applicable. No additional management support is needed unless otherwise documented below in the visit note. 

## 2015-10-13 NOTE — Patient Instructions (Signed)
Hypertension Hypertension, commonly called high blood pressure, is when the force of blood pumping through your arteries is too strong. Your arteries are the blood vessels that carry blood from your heart throughout your body. A blood pressure reading consists of a higher number over a lower number, such as 110/72. The higher number (systolic) is the pressure inside your arteries when your heart pumps. The lower number (diastolic) is the pressure inside your arteries when your heart relaxes. Ideally you want your blood pressure below 120/80. Hypertension forces your heart to work harder to pump blood. Your arteries may become narrow or stiff. Having untreated or uncontrolled hypertension can cause heart attack, stroke, kidney disease, and other problems. RISK FACTORS Some risk factors for high blood pressure are controllable. Others are not.  Risk factors you cannot control include:   Race. You may be at higher risk if you are African American.  Age. Risk increases with age.  Gender. Men are at higher risk than women before age 45 years. After age 65, women are at higher risk than men. Risk factors you can control include:  Not getting enough exercise or physical activity.  Being overweight.  Getting too much fat, sugar, calories, or salt in your diet.  Drinking too much alcohol. SIGNS AND SYMPTOMS Hypertension does not usually cause signs or symptoms. Extremely high blood pressure (hypertensive crisis) may cause headache, anxiety, shortness of breath, and nosebleed. DIAGNOSIS To check if you have hypertension, your health care provider will measure your blood pressure while you are seated, with your arm held at the level of your heart. It should be measured at least twice using the same arm. Certain conditions can cause a difference in blood pressure between your right and left arms. A blood pressure reading that is higher than normal on one occasion does not mean that you need treatment. If  it is not clear whether you have high blood pressure, you may be asked to return on a different day to have your blood pressure checked again. Or, you may be asked to monitor your blood pressure at home for 1 or more weeks. TREATMENT Treating high blood pressure includes making lifestyle changes and possibly taking medicine. Living a healthy lifestyle can help lower high blood pressure. You may need to change some of your habits. Lifestyle changes may include:  Following the DASH diet. This diet is high in fruits, vegetables, and whole grains. It is low in salt, red meat, and added sugars.  Keep your sodium intake below 2,300 mg per day.  Getting at least 30-45 minutes of aerobic exercise at least 4 times per week.  Losing weight if necessary.  Not smoking.  Limiting alcoholic beverages.  Learning ways to reduce stress. Your health care provider may prescribe medicine if lifestyle changes are not enough to get your blood pressure under control, and if one of the following is true:  You are 18-59 years of age and your systolic blood pressure is above 140.  You are 60 years of age or older, and your systolic blood pressure is above 150.  Your diastolic blood pressure is above 90.  You have diabetes, and your systolic blood pressure is over 140 or your diastolic blood pressure is over 90.  You have kidney disease and your blood pressure is above 140/90.  You have heart disease and your blood pressure is above 140/90. Your personal target blood pressure may vary depending on your medical conditions, your age, and other factors. HOME CARE INSTRUCTIONS    Have your blood pressure rechecked as directed by your health care provider.   Take medicines only as directed by your health care provider. Follow the directions carefully. Blood pressure medicines must be taken as prescribed. The medicine does not work as well when you skip doses. Skipping doses also puts you at risk for  problems.  Do not smoke.   Monitor your blood pressure at home as directed by your health care provider. SEEK MEDICAL CARE IF:   You think you are having a reaction to medicines taken.  You have recurrent headaches or feel dizzy.  You have swelling in your ankles.  You have trouble with your vision. SEEK IMMEDIATE MEDICAL CARE IF:  You develop a severe headache or confusion.  You have unusual weakness, numbness, or feel faint.  You have severe chest or abdominal pain.  You vomit repeatedly.  You have trouble breathing. MAKE SURE YOU:   Understand these instructions.  Will watch your condition.  Will get help right away if you are not doing well or get worse.   This information is not intended to replace advice given to you by your health care provider. Make sure you discuss any questions you have with your health care provider.   Document Released: 07/24/2005 Document Revised: 12/08/2014 Document Reviewed: 05/16/2013 Elsevier Interactive Patient Education 2016 [employer]  

## 2015-10-13 NOTE — Progress Notes (Signed)
Subjective:  Patient ID: Hannah Miranda, female    DOB: 12/09/1965  Age: 50 y.o. MRN: [id number]  CC: Hypertension   HPI Hannah Miranda presents for a blood pressure check. She had been treated for hypertension and has been working very diligently on her lifestyle modifications. A month or 2 ago she started developing low blood pressure with dizziness and lightheadedness. She stopped taking the ARB-hydrochlorothiazide combination and for the last few weeks has felt better. She also tells me that at home her blood pressures been well controlled. She feels well today and offers no new complaints.  Outpatient Prescriptions Prior to Visit  Medication Sig Dispense Refill  . ALPRAZolam (XANAX) 0.5 MG tablet Take 1 tablet (0.5 mg total) by mouth 2 (two) times daily as needed. 90 tablet 2  . cetirizine (ZYRTEC) 10 MG tablet Take 10 mg by mouth daily.    . mometasone (NASONEX) 50 MCG/ACT nasal spray Place 2 sprays into the nose as needed.    . montelukast (SINGULAIR) 10 MG tablet Take 10 mg by mouth as needed. SPRING & FALL    . naproxen (NAPROSYN) 375 MG tablet Take 1 tablet (375 mg total) by mouth 2 (two) times daily with a meal. 60 tablet 1  . omeprazole (PRILOSEC) 20 MG capsule Take 1 capsule (20 mg total) by mouth daily. 30 capsule 11  . valsartan-hydrochlorothiazide (DIOVAN HCT) 160-12.5 MG tablet Take 1 tablet by mouth daily. 90 tablet 3   No facility-administered medications prior to visit.    ROS Review of Systems  Constitutional: Negative.  Negative for fever, chills, diaphoresis, appetite change and fatigue.  HENT: Negative.   Eyes: Negative.  Negative for visual disturbance.  Respiratory: Negative.  Negative for cough, choking, chest tightness, shortness of breath and stridor.   Cardiovascular: Negative.  Negative for chest pain, palpitations and leg swelling.  Gastrointestinal: Negative.  Negative for nausea, vomiting, abdominal pain, diarrhea, constipation and blood in stool.    Endocrine: Negative.   Genitourinary: Negative.   Musculoskeletal: Positive for neck pain. Negative for myalgias, back pain, joint swelling, arthralgias and neck stiffness.  Skin: Negative.  Negative for color change, pallor and wound.  Allergic/Immunologic: Negative.   Neurological: Negative.  Negative for dizziness, tremors, weakness, light-headedness and numbness.  Hematological: Negative.  Negative for adenopathy. Does not bruise/bleed easily.  Psychiatric/Behavioral: Negative.     Objective:  BP 110/78 mmHg  Pulse 72  Temp(Src) 97.9 F (36.6 C) (Oral)  Resp 16  Ht  (1.6 m)  Wt 174 lb (78.926 kg)  BMI 30.83 kg/m2  SpO2 96%  BP Readings from Last 3 Encounters:  10/13/15 110/78  05/24/15 110/70  05/03/15 110/86    Wt Readings from Last 3 Encounters:  10/13/15 174 lb (78.926 kg)  05/24/15 177 lb (80.287 kg)  05/03/15 178 lb (80.74 kg)    Physical Exam  Constitutional: She is oriented to person, place, and time. No distress.  HENT:  Mouth/Throat: Oropharynx is clear and moist. No oropharyngeal exudate.  Eyes: Conjunctivae are normal. Right eye exhibits no discharge. Left eye exhibits no discharge. No scleral icterus.  Neck: Normal range of motion. Neck supple. No JVD present. No tracheal deviation present. No thyromegaly present.  Cardiovascular: Normal rate, regular rhythm, normal heart sounds and intact distal pulses.  Exam reveals no gallop and no friction rub.   No murmur heard. Pulmonary/Chest: Effort normal and breath sounds normal. No stridor. No respiratory distress. She has no wheezes. She has no rales. She  exhibits no tenderness.  Abdominal: Soft. Bowel sounds are normal. She exhibits no distension and no mass. There is no tenderness. There is no rebound and no guarding.  Musculoskeletal: Normal range of motion. She exhibits no edema or tenderness.  Lymphadenopathy:    She has no cervical adenopathy.  Neurological: She is oriented to person, place, and  time.  Skin: Skin is warm and dry. No rash noted. She is not diaphoretic. No erythema. No pallor.  Vitals reviewed.   Lab Results  Component Value Date   WBC 6.8 07/24/2014   HGB 13.7 07/24/2014   HCT 40.5 07/24/2014   PLT 267.0 07/24/2014   GLUCOSE 92 07/24/2014   CHOL 181 07/24/2014   TRIG 108.0 07/24/2014   HDL 34.10* 07/24/2014   LDLCALC 125* 07/24/2014   ALT 14 07/24/2014   AST 19 07/24/2014   NA 138 07/24/2014   K 4.2 07/24/2014   CL 104 07/24/2014   CREATININE 0.7 07/24/2014   BUN 10 07/24/2014   CO2 27 07/24/2014   TSH 2.054 07/24/2014    [place] Soft Tissue Head/neck  05/27/2015  CLINICAL DATA:  Thyroid nodule. EXAM: THYROID ULTRASOUND TECHNIQUE: Ultrasound examination of the thyroid gland and adjacent soft tissues was performed. COMPARISON:  MRI of May 16, 2015. FINDINGS: Right thyroid lobe Measurements: 3.6 x 1.7 x 1.2 cm. Solid nodule measuring 1.2 x 1.0 x 0.7 cm is noted in midpole. Left thyroid lobe Measurements: 3.5 x 1.4 x 1.3 cm.  No nodules visualized. Isthmus Thickness: 5 mm.  No nodules visualized. Lymphadenopathy None visualized. IMPRESSION: 1.2 cm solid nodule seen in midpole of right thyroid lobe. Findings do not meet current SRU consensus criteria for biopsy. Follow-up by clinical exam is recommended. If patient has known risk factors for thyroid carcinoma, consider follow-up ultrasound in 12 months. If patient is clinically hyperthyroid, consider nuclear medicine thyroid uptake and scan.Reference: Management of Thyroid Nodules Detected at US: Society of Radiologists in Ultrasound Consensus Conference Statement. Radiology 2005; [id number]. Electronically Signed   By: Lupita RaiderJames  Green Jr, M.D.   On: 05/27/2015 15:00    Assessment & Plan:   Hannah Landngela was seen today for hypertension.  Diagnoses and all orders for this visit:  Radiculitis of left cervical region- She is getting significant symptom relief with physical therapy as well as an anti-inflammatory. -      naproxen (NAPROSYN) 375 MG tablet; Take 1 tablet (375 mg total) by mouth 2 (two) times daily with a meal.  DDD (degenerative disc disease), cervical  Essential hypertension- her blood pressure is well-controlled on no medications, she was praised for her lifestyle modifications, she will continue to monitor her blood pressure at home and she will let me know if it becomes elevated or if she develops any new symptoms.  Other orders -     montelukast (SINGULAIR) 10 MG tablet; Take 1 tablet (10 mg total) by mouth as needed. SPR[place]  I have discontinued Ms. Steury's valsartan-hydrochlorothiazide. I am also having her maintain her cetirizine, montelukast, mometasone, omeprazole, ALPRAZolam, naproxen, and gabapentin.  Meds ordered this encounter  Medications  . gabapentin (NEURONTIN) 100 MG capsule    Sig: Take 100 mg by mouth 3 (three) times daily.    Refill:  1     Follow-up: Return in about 4 months (around 02/12/2016).  Sanda Lingerhomas Tyrell Seifer, MD

## 2015-10-14 DIAGNOSIS — J301 Allergic rhinitis due to pollen: Secondary | ICD-10-CM | POA: Insufficient documentation

## 2015-10-14 MED ORDER — MONTELUKAST SODIUM 10 MG PO TABS: 10.0000 mg | ORAL_TABLET | ORAL | Status: DC | PRN

## 2015-10-14 MED ORDER — NAPROXEN 375 MG PO TABS: 375.0000 mg | ORAL_TABLET | Freq: Two times a day (BID) | ORAL | Status: DC

## 2015-11-15 ENCOUNTER — Encounter: Payer: Self-pay | Admitting: Internal Medicine

## 2015-11-16 ENCOUNTER — Other Ambulatory Visit: Payer: Self-pay | Admitting: Internal Medicine

## 2016-05-08 ENCOUNTER — Encounter: Payer: Self-pay | Admitting: Internal Medicine

## 2016-05-08 ENCOUNTER — Other Ambulatory Visit: Payer: Self-pay | Admitting: Internal Medicine

## 2016-05-08 DIAGNOSIS — E041 Nontoxic single thyroid nodule: Secondary | ICD-10-CM

## 2016-07-17 ENCOUNTER — Other Ambulatory Visit: Payer: Self-pay | Admitting: Internal Medicine

## 2016-07-17 ENCOUNTER — Ambulatory Visit
Admission: RE | Admit: 2016-07-17 | Discharge: 2016-07-17 | Disposition: A | Discharge status: Home / Self Care | Payer: Managed Care, Other (non HMO) | Source: Ambulatory Visit | Attending: Internal Medicine | Admitting: Internal Medicine

## 2016-07-17 DIAGNOSIS — E041 Nontoxic single thyroid nodule: Secondary | ICD-10-CM

## 2016-07-18 ENCOUNTER — Encounter: Payer: Self-pay | Admitting: Internal Medicine

## 2016-07-18 ENCOUNTER — Other Ambulatory Visit: Payer: Self-pay | Admitting: Internal Medicine

## 2016-07-18 ENCOUNTER — Other Ambulatory Visit (INDEPENDENT_AMBULATORY_CARE_PROVIDER_SITE_OTHER): Payer: Managed Care, Other (non HMO)

## 2016-07-18 DIAGNOSIS — I10 Essential (primary) hypertension: Secondary | ICD-10-CM | POA: Diagnosis not present

## 2016-07-18 DIAGNOSIS — R221 Localized swelling, mass and lump, neck: Secondary | ICD-10-CM | POA: Insufficient documentation

## 2016-07-18 LAB — BASIC METABOLIC PANEL
BUN: 9 mg/dL (ref 6–23)
CALCIUM: 9.7 mg/dL (ref 8.4–10.5)
CHLORIDE: 102 meq/L (ref 96–112)
CO2: 31 mEq/L (ref 19–32)
CREATININE: 0.69 mg/dL (ref 0.40–1.20)
GFR: 95.8 mL/min (ref >60.00)
Glucose, Bld: 111 mg/dL — ABNORMAL HIGH (ref 70–99)
Potassium: 3.8 mEq/L (ref 3.5–5.1)
Sodium: 140 mEq/L (ref 135–145)

## 2016-07-22 IMAGING — MR MR CERVICAL SPINE W/O CM
4 of 5 series · 28 of 48 positions shown · non-contrast
Comparison: Cervical radiographs dated 05/03/2015

CLINICAL DATA: Radiculitis of the left cervical region. Left
scapular pain. Left arm and hand weakness and numbness in the left
hand.

EXAM:
MRI CERVICAL SPINE WITHOUT CONTRAST
TECHNIQUE: Multiplanar, multisequence MR imaging of the cervical spine was
performed. No intravenous contrast was administered.

[Series 5: T1 · sagittal · 3.0mm · 0.69mm/px · 7 of 15 slices shown]
[im 1/15]
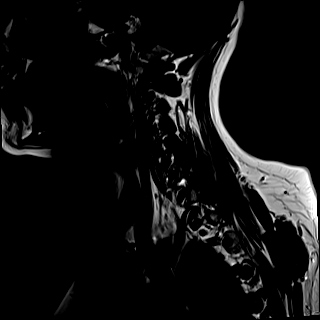
[im 3/15]
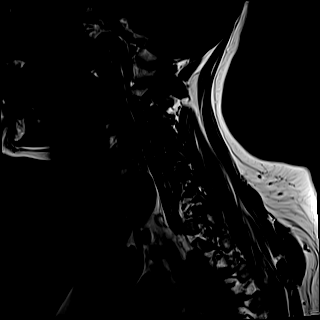
[im 5/15]
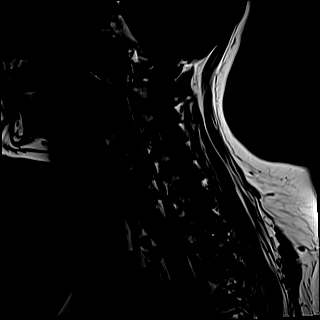
[im 8/15]
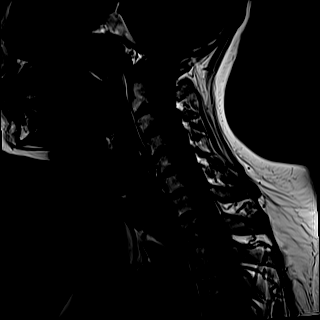
[im 10/15]
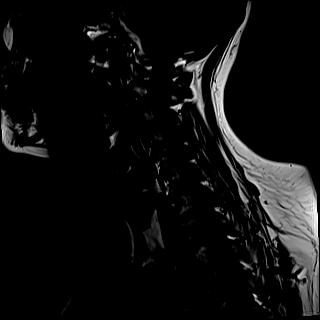
[im 12/15]
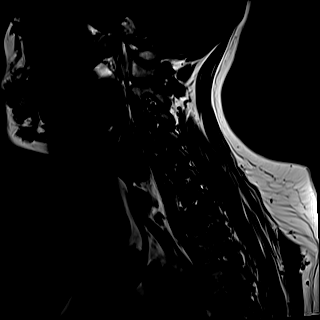
[im 15/15]
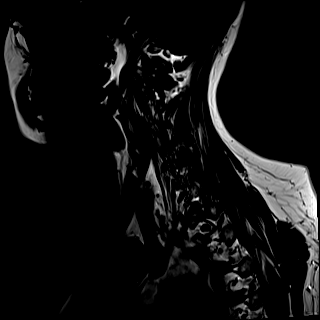

[Series 6: T2 · sagittal · 3.0mm · 0.57mm/px · 7 of 15 slices shown (1 of 2)]
[im 1/15]
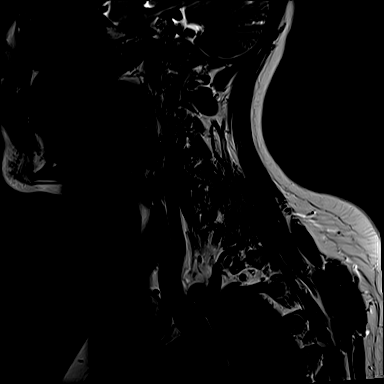
[im 3/15]
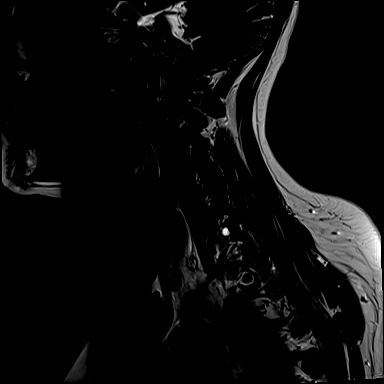
[im 5/15]
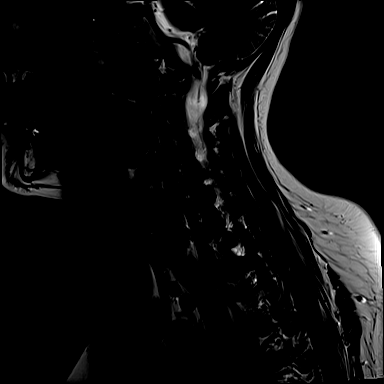
[im 8/15]
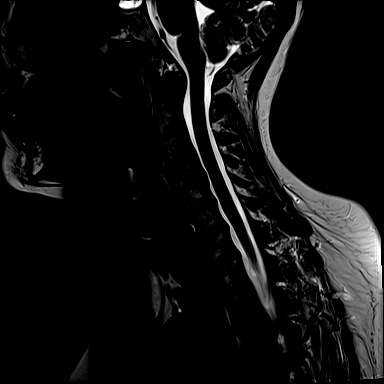
[im 10/15]
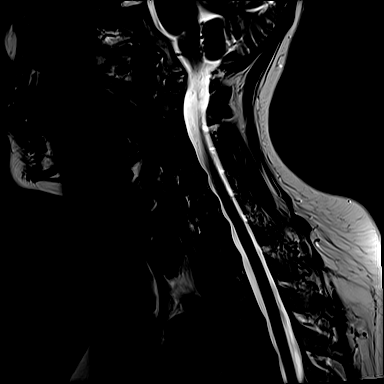
[im 12/15]
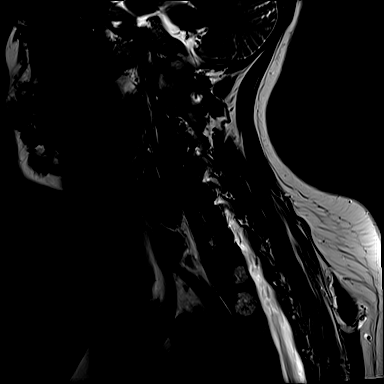
[im 15/15]
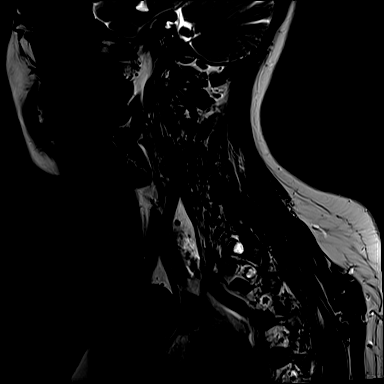

[Series 7: STIR · sagittal · 3.0mm · 0.34mm/px · 6 of 15 slices shown]
[im 1/15]
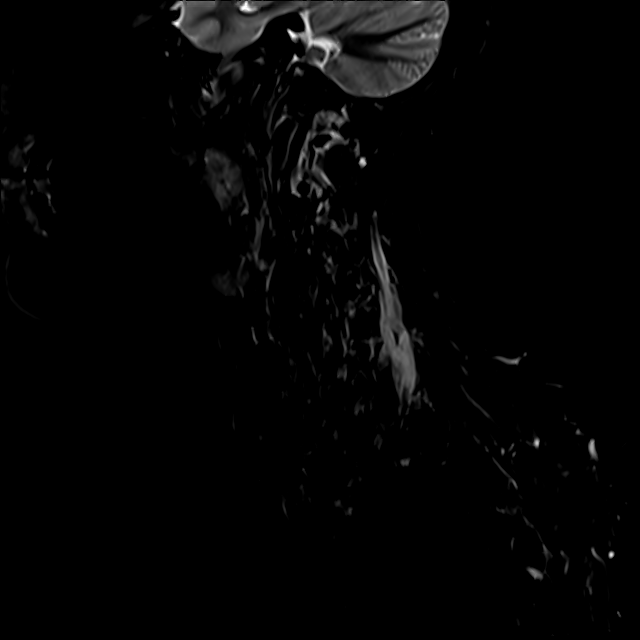
[im 3/15]
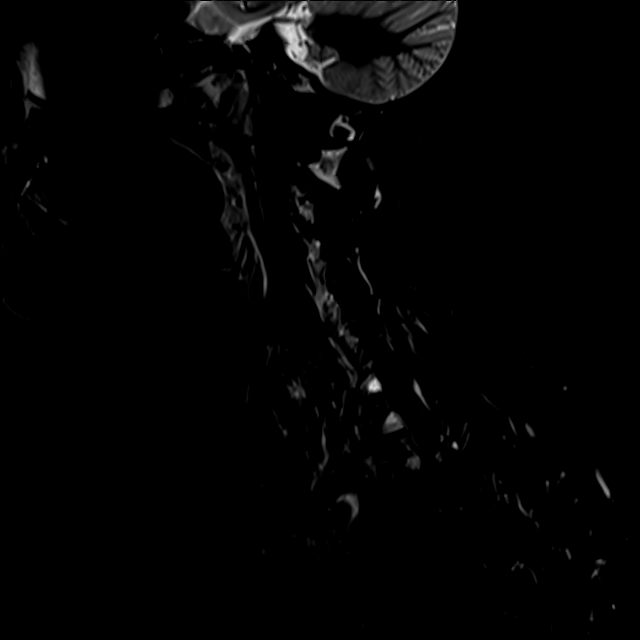
[im 5/15]
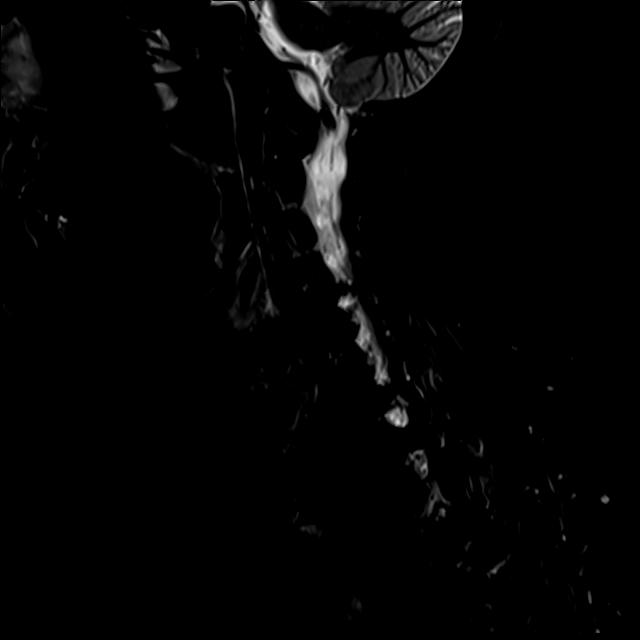
[im 8/15]
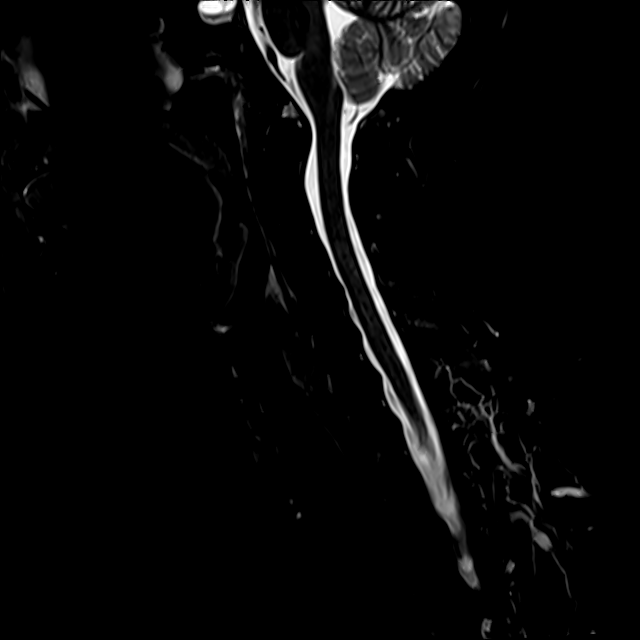
[im 10/15]
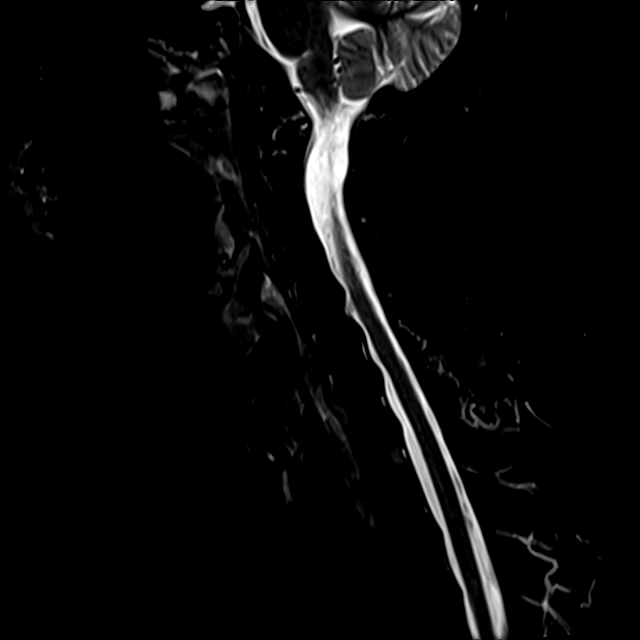
[im 12/15]
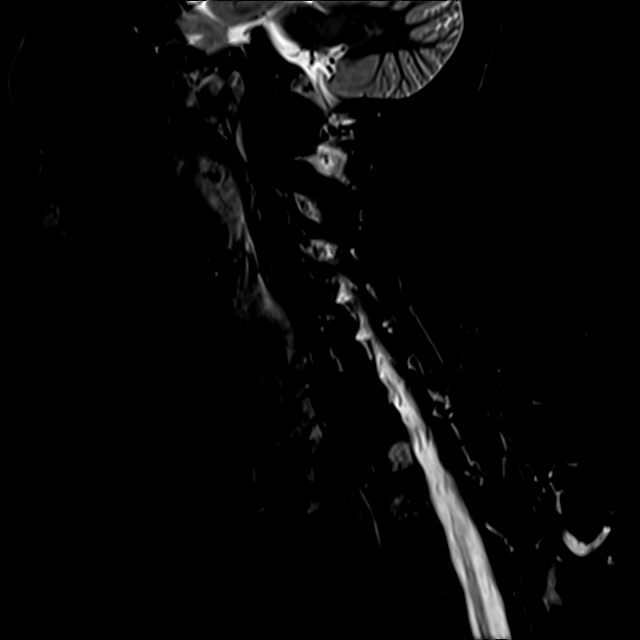

[Series 8: T2 · axial · 3.0mm · 0.56mm/px · z∈[-61,+39]mm · 8 of 32 slices shown (2 of 2)]
[im 1/32]
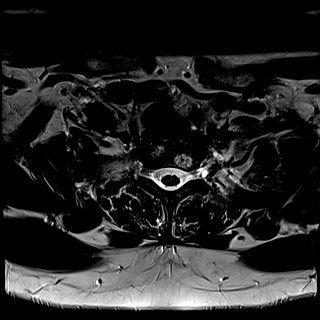
[im 5/32]
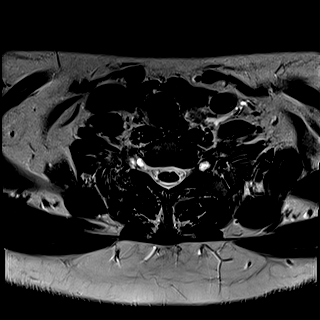
[im 10/32]
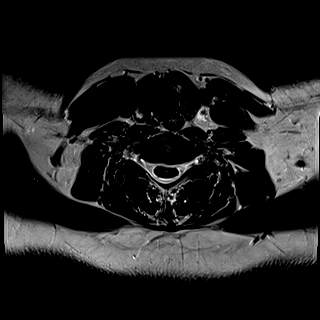
[im 15/32]
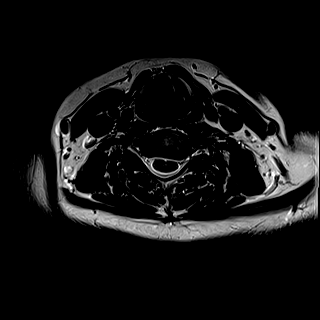
[im 17/32]
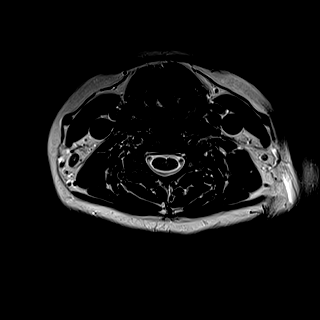
[im 22/32]
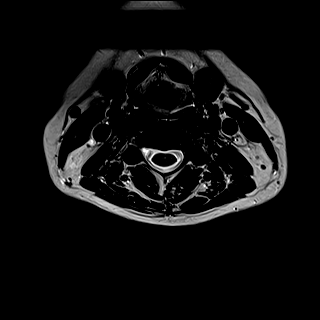
[im 27/32]
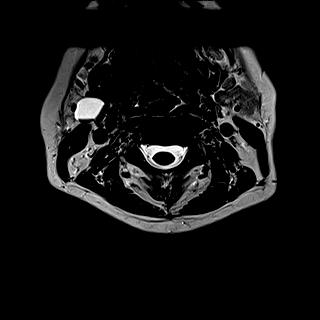
[im 32/32]
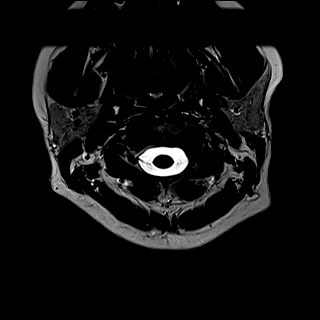

[28 of 48 positions shown; findings below may reference images not displayed]

FINDINGS: The visualized intracranial contents and cervical spinal cord are
normal. The patient has multiple slightly dilated nerve root sleeves
including on the right and left at C6-7 and C7-T1.

There is a cystic lesion just posterior to the right submandibular
gland measuring 24 x 15 x 14 mm. There is a fluid fluid level in the
cyst with some debris at the inferior aspect of the cyst. This is
consistent with a benign second brachial cleft cyst.

There are benign hemangiomata in T1 and T2. There are some dilated
lymphatic channels just posterior to the inferior tip of the left
lobe with only thyroid gland, nonspecific.

There is an 8 mm complex nodule in the posterior aspect of the right
lobe of the thyroid gland.

Craniocervical junction through C3-4: Normal.

C4-5: Minimal uncinate spurs to the right and left without neural
impingement.

C5-6: Small uncinate spurs to the right and left without neural
impingement. The spurs to the left are slightly more prominent than
to the right.

C6-7:  Tiny central disc bulge with no neural impingement.

C7-T1:  Normal disc.  Normal facet joints.
IMPRESSION: 1. No significant degenerative disc and joint disease in the
cervical spine. No areas of neural impingement.
2. Small benign second branchial cleft cyst.
3. 8 mm complex nodule in the right lobe of the thyroid gland.

## 2016-08-01 ENCOUNTER — Encounter: Payer: Self-pay | Admitting: Internal Medicine

## 2016-08-02 NOTE — Telephone Encounter (Signed)
The CT has been in Dr. Review with insurance for over a week. I called and checked on it yesterday. Will check again tomorrow if still in review.  Pt aware

## 2016-08-10 ENCOUNTER — Encounter: Payer: Self-pay | Admitting: Internal Medicine

## 2016-08-10 ENCOUNTER — Ambulatory Visit (INDEPENDENT_AMBULATORY_CARE_PROVIDER_SITE_OTHER)
Admission: RE | Admit: 2016-08-10 | Discharge: 2016-08-10 | Disposition: A | Discharge status: Home / Self Care | Payer: Managed Care, Other (non HMO) | Source: Ambulatory Visit | Attending: Internal Medicine | Admitting: Internal Medicine

## 2016-08-10 DIAGNOSIS — R221 Localized swelling, mass and lump, neck: Secondary | ICD-10-CM

## 2016-08-10 MED ORDER — IOPAMIDOL (ISOVUE-300) INJECTION 61%
75.0000 mL | Freq: Once | INTRAVENOUS | Status: AC | PRN
  Administered 2016-08-10: 75 mL via INTRAVENOUS

## 2016-08-17 ENCOUNTER — Other Ambulatory Visit: Payer: Self-pay | Admitting: Internal Medicine

## 2016-08-17 DIAGNOSIS — F411 Generalized anxiety disorder: Secondary | ICD-10-CM

## 2016-08-17 DIAGNOSIS — J301 Allergic rhinitis due to pollen: Secondary | ICD-10-CM

## 2016-08-30 ENCOUNTER — Other Ambulatory Visit: Payer: Self-pay | Admitting: Internal Medicine

## 2016-08-30 DIAGNOSIS — F411 Generalized anxiety disorder: Secondary | ICD-10-CM

## 2016-08-30 NOTE — Telephone Encounter (Signed)
rx faxed to [facility] 

## 2016-11-13 ENCOUNTER — Other Ambulatory Visit: Payer: Self-pay | Admitting: Internal Medicine

## 2016-11-13 DIAGNOSIS — Z1231 Encounter for screening mammogram for malignant neoplasm of breast: Secondary | ICD-10-CM

## 2016-11-14 ENCOUNTER — Other Ambulatory Visit: Payer: Self-pay | Admitting: Internal Medicine

## 2016-11-14 DIAGNOSIS — I10 Essential (primary) hypertension: Secondary | ICD-10-CM

## 2016-11-20 ENCOUNTER — Other Ambulatory Visit: Payer: Self-pay | Admitting: Internal Medicine

## 2016-11-20 DIAGNOSIS — I10 Essential (primary) hypertension: Secondary | ICD-10-CM

## 2016-11-28 ENCOUNTER — Telehealth: Payer: Self-pay

## 2016-11-28 ENCOUNTER — Other Ambulatory Visit (INDEPENDENT_AMBULATORY_CARE_PROVIDER_SITE_OTHER): Payer: Managed Care, Other (non HMO)

## 2016-11-28 ENCOUNTER — Ambulatory Visit (INDEPENDENT_AMBULATORY_CARE_PROVIDER_SITE_OTHER): Payer: Managed Care, Other (non HMO) | Admitting: Internal Medicine

## 2016-11-28 ENCOUNTER — Encounter: Payer: Self-pay | Admitting: Internal Medicine

## 2016-11-28 VITALS — BP 144/86 | HR 68 | Temp 98.6°F | Resp 16 | Ht 63.0 in | Wt 182.2 lb

## 2016-11-28 DIAGNOSIS — I10 Essential (primary) hypertension: Secondary | ICD-10-CM

## 2016-11-28 DIAGNOSIS — R739 Hyperglycemia, unspecified: Secondary | ICD-10-CM

## 2016-11-28 DIAGNOSIS — E785 Hyperlipidemia, unspecified: Secondary | ICD-10-CM | POA: Insufficient documentation

## 2016-11-28 DIAGNOSIS — Z1231 Encounter for screening mammogram for malignant neoplasm of breast: Secondary | ICD-10-CM

## 2016-11-28 DIAGNOSIS — E041 Nontoxic single thyroid nodule: Secondary | ICD-10-CM

## 2016-11-28 LAB — THYROID PANEL WITH TSH
Free Thyroxine Index: 2.3 (ref 1.4–3.8)
T3 UPTAKE: 28 % (ref 22–35)
T4 TOTAL: 8.1 ug/dL (ref 4.5–12.0)
TSH: 1.42 m[IU]/L

## 2016-11-28 LAB — LIPID PANEL
CHOLESTEROL: 185 mg/dL (ref 0–200)
HDL: 35.3 mg/dL — ABNORMAL LOW (ref >39.00)
LDL Cholesterol: 121 mg/dL — ABNORMAL HIGH (ref 0–99)
NonHDL: 149.9
TRIGLYCERIDES: 143 mg/dL (ref 0.0–149.0)
Total CHOL/HDL Ratio: 5
VLDL: 28.6 mg/dL (ref 0.0–40.0)

## 2016-11-28 LAB — CBC WITH DIFFERENTIAL/PLATELET
BASOS PCT: 1.2 % (ref 0.0–3.0)
Basophils Absolute: 0.1 10*3/uL (ref 0.0–0.1)
Eosinophils Absolute: 0.1 10*3/uL (ref 0.0–0.7)
Eosinophils Relative: 1.8 % (ref 0.0–5.0)
HCT: 38 % (ref 36.0–46.0)
Hemoglobin: 13.2 g/dL (ref 12.0–15.0)
Lymphocytes Relative: 20.6 % (ref 12.0–46.0)
Lymphs Abs: 1.1 10*3/uL (ref 0.7–4.0)
MCHC: 34.9 g/dL (ref 30.0–36.0)
MCV: 85.8 fl (ref 78.0–100.0)
MONO ABS: 0.5 10*3/uL (ref 0.1–1.0)
Monocytes Relative: 9.1 % (ref 3.0–12.0)
NEUTROS ABS: 3.6 10*3/uL (ref 1.4–7.7)
NEUTROS PCT: 67.3 % (ref 43.0–77.0)
Platelets: 237 10*3/uL (ref 150.0–400.0)
RBC: 4.43 Mil/uL (ref 3.87–5.11)
RDW: 12.2 % (ref 11.5–15.5)
WBC: 5.3 10*3/uL (ref 4.0–10.5)

## 2016-11-28 LAB — COMPREHENSIVE METABOLIC PANEL
ALT: 16 U/L (ref 0–35)
AST: 20 U/L (ref 0–37)
Albumin: 4.2 g/dL (ref 3.5–5.2)
Alkaline Phosphatase: 63 U/L (ref 39–117)
BUN: 9 mg/dL (ref 6–23)
CHLORIDE: 108 meq/L (ref 96–112)
CO2: 29 meq/L (ref 19–32)
CREATININE: 0.7 mg/dL (ref 0.40–1.20)
Calcium: 9.3 mg/dL (ref 8.4–10.5)
GFR: 94.08 mL/min (ref >60.00)
Glucose, Bld: 99 mg/dL (ref 70–99)
Potassium: 4.7 mEq/L (ref 3.5–5.1)
SODIUM: 141 meq/L (ref 135–145)
Total Bilirubin: 0.4 mg/dL (ref 0.2–1.2)
Total Protein: 6.8 g/dL (ref 6.0–8.3)

## 2016-11-28 LAB — HEMOGLOBIN A1C: HEMOGLOBIN A1C: 5.6 % (ref 4.6–6.5)

## 2016-11-28 MED ORDER — VALSARTAN 320 MG PO TABS: 320.0000 mg | ORAL_TABLET | Freq: Every day | ORAL | 3 refills | Status: DC

## 2016-11-28 NOTE — Progress Notes (Signed)
Pre visit review using our clinic review tool, if applicable. No additional management support is needed unless otherwise documented below in the visit note. 

## 2016-11-28 NOTE — Patient Instructions (Signed)

## 2016-11-28 NOTE — Progress Notes (Signed)
Subjective:  Patient ID: Hannah Miranda, female    DOB: October 17, 1965  Age: 51 y.o. MRN: [id number]  CC: Hypertension   HPI DANNA SEWELL presents for a BP check - She has not taken any antihypertensives for about a week. She complains that she's had a few headaches but she denies blurred vision/CP/SOB/DOE/palpitations/edema/fatigue. She does complain of weight gain.  Outpatient Medications Prior to Visit  Medication Sig Dispense Refill  . ALPRAZolam (XANAX) 0.5 MG tablet TAKE 1 TABLET BY MOUTH TWICE A DAY AS NEEDED 60 tablet 3  . cetirizine (ZYRTEC) 10 MG tablet Take 10 mg by mouth daily.    . mometasone (NASONEX) 50 MCG/ACT nasal spray Place 2 sprays into the nose as needed.    . montelukast (SINGULAIR) 10 MG tablet TAKE 1 TABLET BY MOUTH AS NEEDED FOR SPRING AND FALL 90 tablet 1  . naproxen (NAPROSYN) 375 MG tablet Take 1 tablet (375 mg total) by mouth 2 (two) times daily with a meal. 60 tablet 3  . omeprazole (PRILOSEC) 20 MG capsule TAKE 1 CAPSULE (20 MG TOTAL) BY MOUTH DAILY. 90 capsule 1  . gabapentin (NEURONTIN) 100 MG capsule TAKE 1 CAPSULE THREE TIMES A DAY 90 capsule 11   No facility-administered medications prior to visit.     ROS Review of Systems  Constitutional: Positive for unexpected weight change (wt gain). Negative for appetite change, chills, diaphoresis and fatigue.  Eyes: Negative for visual disturbance.  Respiratory: Negative for cough, chest tightness, shortness of breath, wheezing and stridor.   Cardiovascular: Negative for chest pain, palpitations and leg swelling.  Gastrointestinal: Negative for abdominal pain, constipation, diarrhea, nausea and vomiting.  Endocrine: Negative.  Negative for cold intolerance, heat intolerance, polydipsia, polyphagia and polyuria.  Genitourinary: Negative.  Negative for decreased urine volume, difficulty urinating, dysuria, frequency, hematuria and urgency.  Musculoskeletal: Negative for back pain and neck pain.  Skin:  Negative.  Negative for color change and rash.  Neurological: Negative.  Negative for dizziness, weakness, numbness and headaches.  Hematological: Negative for adenopathy. Does not bruise/bleed easily.  Psychiatric/Behavioral: Negative.     Objective:  BP (!) 144/86 (BP Location: Left Arm, Patient Position: Sitting, Cuff Size: Large)   Pulse 68   Temp 98.6 F (37 C) (Oral)   Resp 16   Ht  (1.6 m)   Wt 182 lb 4 oz (82.7 kg)   SpO2 98%   BMI 32.28 kg/m   BP Readings from Last 3 Encounters:  11/28/16 (!) 144/86  10/13/15 110/78  05/24/15 110/70    Wt Readings from Last 3 Encounters:  11/28/16 182 lb 4 oz (82.7 kg)  10/13/15 174 lb (78.9 kg)  05/24/15 177 lb (80.3 kg)    Physical Exam  Constitutional: She is oriented to person, place, and time. No distress.  HENT:  Mouth/Throat: Oropharynx is clear and moist. No oropharyngeal exudate.  Eyes: Conjunctivae are normal. Right eye exhibits no discharge. Left eye exhibits no discharge. No scleral icterus.  Neck: Normal range of motion. Neck supple. No JVD present. No tracheal deviation present. No thyromegaly present.  Cardiovascular: Normal rate, regular rhythm, normal heart sounds and intact distal pulses.  Exam reveals no gallop and no friction rub.   No murmur heard. Pulmonary/Chest: Effort normal and breath sounds normal. No stridor. No respiratory distress. She has no wheezes. She has no rales. She exhibits no tenderness.  Abdominal: Soft. Bowel sounds are normal. She exhibits no distension and no mass. There is no tenderness. There is  no rebound and no guarding.  Musculoskeletal: Normal range of motion. She exhibits no edema, tenderness or deformity.  Lymphadenopathy:    She has no cervical adenopathy.  Neurological: She is oriented to person, place, and time.  Skin: Skin is warm and dry. No rash noted. She is not diaphoretic. No erythema. No pallor.  Vitals reviewed.   Lab Results  Component Value Date   WBC 5.3  11/28/2016   HGB 13.2 11/28/2016   HCT 38.0 11/28/2016   PLT 237.0 11/28/2016   GLUCOSE 99 11/28/2016   CHOL 185 11/28/2016   TRIG 143.0 11/28/2016   HDL 35.30 (L) 11/28/2016   LDLCALC 121 (H) 11/28/2016   ALT 16 11/28/2016   AST 20 11/28/2016   NA 141 11/28/2016   K 4.7 11/28/2016   CL 108 11/28/2016   CREATININE 0.70 11/28/2016   BUN 9 11/28/2016   CO2 29 11/28/2016   TSH 1.42 11/28/2016   HGBA1C 5.6 11/28/2016    Ct Soft Tissue Neck W Contrast  Result Date: 08/10/2016 CLINICAL DATA:  Enlarging right neck mass, present for 2 years. EXAM: CT NECK WITH CONTRAST TECHNIQUE: Multidetector CT imaging of the neck was performed using the standard protocol following the bolus administration of intravenous contrast. CONTRAST:  75mL ISOVUE-300 IOPAMIDOL (ISOVUE-300) INJECTION 61% COMPARISON:  Thyroid ultrasound 07/17/2016. Cervical spine MRI 05/16/2015. FINDINGS: Pharynx and larynx: No pharyngeal mass or parapharyngeal inflammatory change. Small calcifications at the level of the right anterior tonsillar pillar. Closed glottis. Salivary glands: Parotid and submandibular glands are unremarkable. Thyroid: Known right thyroid nodule is not well seen by CT. Lymph nodes: No enlarged lymph nodes are identified in the neck. Vascular: Major vascular structures of the neck appear patent. Limited intracranial: Unremarkable. Visualized orbits: Unremarkable. Mastoids and visualized paranasal sinuses: Clear. Skeleton: Mild cervical spondylosis. Upper chest: Unremarkable. Other: Mass situated between the right submandibular gland and anterior border of the right sternocleidomastoid muscle has mildly enlarged from the prior cervical spine MRI, currently measuring 1.8 x 2.1 x 3.6 cm. These measurements are slightly less than on the more recent ultrasound. This mass demonstrates homogeneous low attenuation internally (though greater than water attenuation), with the complex material/debris demonstrated on prior  ultrasound and MRI not readily apparent by CT. No definite solid enhancing component is identified. IMPRESSION: 3.6 cm cystic right neck mass, mildly enlarged from a 2016 MRI though still most consistent with a benign second branchial cleft cyst. Electronically Signed   By: Sebastian Ache M.D.   On: 08/10/2016 15:53    Assessment & Plan:   Adalia was seen today for hypertension.  Diagnoses and all orders for this visit:  Essential hypertension- her blood pressure is not adequately well controlled. Her labs are negative for any evidence of secondary causes or end organ damage. Will control her blood pressure with an ARB. -     Comprehensive metabolic panel; Future -     CBC with Differential/Platelet; Future -     valsartan (DIOVAN) 320 MG tablet; Take 1 tablet (320 mg total) by mouth daily.  Right thyroid nodule- recent CT scan showed that she had a benign second brachial cleft cyst but no abnormalities in her thyroid gland. She has no compressive symptoms, appears euthyroid, and has normal TFTs. Will follow this periodically over the next few years. -     Thyroid Panel With TSH; Future  Hyperlipidemia LDL goal <130- her Framingham risk score is only 2% so I do not recommend that she take a statin. -  Lipid panel; Future  Hyperglycemia- despite her weight gain her A1c is only 5.6% indicating mild prediabetes. No medications are needed. She agrees to work on her lifestyle modifications. -     Hemoglobin A1c; Future   I have discontinued Ms. Lizarraga's gabapentin. I am also having her start on valsartan. Additionally, I am having her maintain her cetirizine, mometasone, naproxen, montelukast, omeprazole, and ALPRAZolam.  Meds ordered this encounter  Medications  . valsartan (DIOVAN) 320 MG tablet    Sig: Take 1 tablet (320 mg total) by mouth daily.    Dispense:  90 tablet    Refill:  3     Follow-up: Return in about 3 months (around 02/27/2017).  Sanda Linger, MD

## 2016-11-28 NOTE — Telephone Encounter (Signed)
Order [id number]

## 2016-11-29 ENCOUNTER — Ambulatory Visit
Admission: RE | Admit: 2016-11-29 | Discharge: 2016-11-29 | Disposition: A | Discharge status: Home / Self Care | Payer: Managed Care, Other (non HMO) | Source: Ambulatory Visit | Attending: Internal Medicine | Admitting: Internal Medicine

## 2016-11-29 LAB — HM MAMMOGRAPHY

## 2016-11-30 ENCOUNTER — Encounter: Payer: Self-pay | Admitting: Internal Medicine

## 2016-12-01 NOTE — Telephone Encounter (Signed)
Pt stated that he was not taking his BP med at the time of visit and wants to know if the 160 mg can be rx'ed instead.

## 2016-12-10 LAB — COLOGUARD: COLOGUARD: NEGATIVE

## 2016-12-14 ENCOUNTER — Encounter: Payer: Self-pay | Admitting: Internal Medicine

## 2016-12-14 NOTE — Telephone Encounter (Signed)
Negative results abstracted

## 2016-12-24 ENCOUNTER — Encounter: Payer: Self-pay | Admitting: Internal Medicine

## 2016-12-25 ENCOUNTER — Other Ambulatory Visit: Payer: Self-pay | Admitting: Internal Medicine

## 2016-12-25 DIAGNOSIS — I10 Essential (primary) hypertension: Secondary | ICD-10-CM

## 2017-01-03 ENCOUNTER — Encounter: Payer: Self-pay | Admitting: Internal Medicine

## 2017-01-03 DIAGNOSIS — G8929 Other chronic pain: Secondary | ICD-10-CM

## 2017-01-03 DIAGNOSIS — M25512 Pain in left shoulder: Principal | ICD-10-CM

## 2017-01-03 NOTE — Telephone Encounter (Signed)
Order placed

## 2017-01-03 NOTE — Telephone Encounter (Signed)
Do you know how to order?

## 2017-01-03 NOTE — Telephone Encounter (Signed)
This will be just a regular referral for Rehab and be sure to put a note in it stating [facility] - [address]

## 2017-01-16 ENCOUNTER — Ambulatory Visit: Payer: Managed Care, Other (non HMO) | Attending: Internal Medicine | Admitting: Physical Therapy

## 2017-01-16 ENCOUNTER — Encounter: Payer: Self-pay | Admitting: Physical Therapy

## 2017-01-16 DIAGNOSIS — R293 Abnormal posture: Secondary | ICD-10-CM

## 2017-01-16 DIAGNOSIS — M436 Torticollis: Secondary | ICD-10-CM | POA: Diagnosis present

## 2017-01-16 DIAGNOSIS — M6281 Muscle weakness (generalized): Secondary | ICD-10-CM

## 2017-01-16 DIAGNOSIS — M5030 Other cervical disc degeneration, unspecified cervical region: Secondary | ICD-10-CM | POA: Insufficient documentation

## 2017-01-16 DIAGNOSIS — M25512 Pain in left shoulder: Secondary | ICD-10-CM | POA: Diagnosis present

## 2017-01-16 DIAGNOSIS — M62830 Muscle spasm of back: Secondary | ICD-10-CM

## 2017-01-16 DIAGNOSIS — G8929 Other chronic pain: Secondary | ICD-10-CM

## 2017-01-16 NOTE — Patient Instructions (Signed)
Posture Tips DO: - stand tall and erect - keep chin tucked in - keep head and shoulders in alignment - check posture regularly in mirror or large window - pull head back against headrest in car seat;  Change your position often.  Sit with lumbar support. DON'T: - slouch or slump while watching TV or reading - sit, stand or lie in one position  for too long;  Sitting is especially hard on the spine so if you sit at a desk/use the computer, then stand up often!   Copyright  VHI. All rights reserved.  Posture - Standing   Good posture is important. Avoid slouching and forward head thrust. Maintain curve in low back and align ears over shoul- ders, hips over ankles.  Pull your belly button in toward your back bone.  Stand with even weight in toes and heels.  Ribs lifted up to the sky/ chin down.  Be aware of posture all day.   Copyright  VHI. All rights reserved.  Posture - Sitting   Sit upright, head facing forward. Try using a roll to support lower back. Keep shoulders relaxed, and avoid rounded back. Keep hips level with knees. Avoid crossing legs for long periods. Sit on sit bone , not tail bone,  Do NOT perch on edge of seat. Do not elevate shoulders, sit with relaxed shoulders.   Copyright  VHI. All rights reserved.  Trigger Point Dry Needling  . What is Trigger Point Dry Needling (DN)? o DN is a physical therapy technique used to treat muscle pain and dysfunction. Specifically, DN helps deactivate muscle trigger points (muscle knots).  o A thin filiform needle is used to penetrate the skin and stimulate the underlying trigger point. The goal is for a local twitch response (LTR) to occur and for the trigger point to relax. No medication of any kind is injected during the procedure.   . What Does Trigger Point Dry Needling Feel Like?  o The procedure feels different for each individual patient. Some patients report that they do not actually feel the needle enter the skin and overall  the process is not painful. Very mild bleeding may occur. However, many patients feel a deep cramping in the muscle in which the needle was inserted. This is the local twitch response.   Marland Kitchen. How Will I feel after the treatment? o Soreness is normal, and the onset of soreness may not occur for a few hours. Typically this soreness does not last longer than two days.  o Bruising is uncommon, however; ice can be used to decrease any possible bruising.  o In rare cases feeling tired or nauseous after the treatment is normal. In addition, your symptoms may get worse before they get better, this period will typically not last longer than 24 hours.   . What Can I do After My Treatment? o Increase your hydration by drinking more water for the next 24 hours. o You may place ice or heat on the areas treated that have become sore, however, do not use heat on inflamed or bruised areas. Heat often brings more relief post needling. o You can continue your regular activities, but vigorous activity is not recommended initially after the treatment for 24 hours. o DN is best combined with other physical therapy such as strengthening, stretching, and other therapies.  Garen LahLawrie Mckaylee Dimalanta, PT Certified Exercise Expert for the Aging Adult  01/16/17 8:20 AM Phone: [phone] Fax: [phone]

## 2017-01-16 NOTE — Therapy (Signed)
[facility] [address] [place], [place], [address] Phone: [phone]   Fax:  [phone]  Physical Therapy Evaluation  Patient Details  Name: Hannah Miranda MRN: [id number] Date of Birth: [birth year] Referring Provider: Dione Plover MD  Encounter Date: 01/16/2017      PT End of Session - 01/16/17 6962    Visit Number 1   Number of Visits 12   Date for PT Re-Evaluation 02/27/17   Authorization Type [employer]   PT Start Time 0800   PT Stop Time 0854   PT Time Calculation (min) 54 min   Activity Tolerance Patient tolerated treatment well   Behavior During Therapy [facility] for tasks assessed/performed      Past Medical History:  Diagnosis Date  . Anxiety   . Depression   . GERD (gastroesophageal reflux disease)   . Hypertension     History reviewed. No pertinent surgical history.  There were no vitals filed for this visit.       Subjective Assessment - 01/16/17 0807    Subjective I had trigger point dry needling before and it helped.  I can feel that i dont have as much motion in my left shouder like before   Pertinent History DDD, HTN, cervical DDD, carpal tunnel, meralgia paresthetica   Limitations Lifting;House hold activities   How long can you sit comfortably? 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 Yes   Pain Score 5    Pain Location Shoulder   Pain Orientation Left   Pain Descriptors / Indicators Spasm;Burning;Sore   Pain Type Chronic pain   Pain Onset More than a month ago   Pain Frequency Intermittent   Aggravating Factors  sitting for longer than 30 minutes , sitting too long in same position   Pain Relieving Factors shift weight, stretch             [facility] PT Assessment - 01/16/17 0811      Assessment   Medical Diagnosis chronic left  shoulder pain   Referring Provider Dione Plover MD   Onset Date/Surgical Date 09/19/16   Hand Dominance Right   Next MD Visit July 2018   Prior Therapy yes      Precautions   Precautions None     Restrictions   Weight Bearing Restrictions No     Balance Screen   Has the patient fallen in the past 6 months No   Has the patient had a decrease in activity level because of a fear of falling?  No   Is the patient reluctant to leave their home because of a fear of falling?  No     Home Environment   Living Environment Private residence   Type of Home House   Home Access Stairs to enter   Entrance Stairs-Number of Steps 3   Entrance Stairs-Rails Right   Home Layout One level     Prior Function   Level of Independence Independent     Cognition   Overall Cognitive Status Within Functional Limits for tasks assessed     Observation/Other Assessments   Observations gaurded arms close to side   Focus on Therapeutic Outcomes (FOTO)  Intake 51% limitation 49% predicted     Sensation   Light Touch Appears Intact  Posture/Postural Control   Posture/Postural Control Postural limitations   Postural Limitations Rounded Shoulders;Forward head;Anterior pelvic tilt     ROM / Strength   AROM / PROM / Strength AROM;Strength     AROM   Overall AROM  Deficits   Right Shoulder Extension 30 Degrees   Right Shoulder Flexion 159 Degrees   Right Shoulder ABduction 150 Degrees   Right Shoulder Internal Rotation 62 Degrees   Left Shoulder Extension 30 Degrees   Left Shoulder Flexion 150 Degrees  ERP   Left Shoulder ABduction 135 Degrees  ERP   Left Shoulder Internal Rotation 40 Degrees  ERP   Left Shoulder External Rotation 85 Degrees   Left Shoulder Horizontal ABduction 130 Degrees   Cervical Flexion 55   Cervical Extension 50   Cervical - Right Side Bend 30   Cervical - Left Side Bend 35   Cervical - Right Rotation 60   Cervical - Left Rotation 49     Strength   Overall  Strength Deficits   Right Shoulder Flexion 5/5   Right Shoulder Extension 5/5   Right Shoulder ABduction 5/5   Right Shoulder Internal Rotation 5/5   Right Shoulder External Rotation 5/5   Left Shoulder Flexion 4/5   Left Shoulder Extension 5/5   Left Shoulder ABduction 4/5  pain in medial scapula   Left Shoulder Internal Rotation 4/5   Left Shoulder External Rotation 4/5     Palpation   Palpation comment tenderness over left medial periscapular, over 4/5 rib junction,  pt with increased tissue tension in upper trap and levator on left      Spurling's   Findings Negative   Side Left   Comment right     Distraction Test   Findngs Positive   side Left   Comment pt with decrease tension with distraction            Objective measurements completed on examination: See above findings.          [facility] Adult PT Treatment/Exercise - 01/16/17 0811      Self-Care   Self-Care Other Self-Care Comments   Other Self-Care Comments  education on trigger point dryneedling and precautians     Modalities   Modalities Moist Heat     Moist Heat Therapy   Number Minutes Moist Heat 15 Minutes   Moist Heat Location Cervical;Shoulder  left side only     Manual Therapy   Manual Therapy Soft tissue mobilization   Manual therapy comments pt consented to trigger point dry needling   Soft tissue mobilization left upper trap and levator and medial periscapular muscles          Trigger Point Dry Needling - 01/16/17 0834    Consent Given? Yes   Education Handout Provided Yes   Muscles Treated Upper Body Levator scapulae;Rhomboids;Upper trapezius  left side only   Upper Trapezius Response Twitch reponse elicited;Palpable increased muscle length   Levator Scapulae Response Twitch response elicited;Palpable increased muscle length   Rhomboids Response Twitch response elicited;Palpable increased muscle length              PT Education - 01/16/17 0822    Education provided  Yes   Education Details POC, initial posture, dry needling explanation and precautians and home care.  handout   Person(s) Educated Patient   Methods Explanation;Demonstration   Comprehension Verbalized understanding;Returned demonstration          PT Short Term Goals - 01/16/17 0825  PT SHORT TERM GOAL #1   Title STG=LTG           PT Long Term Goals - 01/16/17 0825      PT LONG TERM GOAL #1   Title Patient will be independent in self management through postural correction and safe self progression of HEP  08/06/15   Time 6   Period Weeks   Status New     PT LONG TERM GOAL #2   Title Cervical extension, sidebending and rotation improved to grossly 50 degrees needed for ADLS and driving with greater ease.   Time 6   Period Weeks   Status New     PT LONG TERM GOAL #3   Title Deep cervical flexor and extensor strength as well as periscapular muscle strength grossly 4+/5 to 5-/5.   Time 6   Period Weeks   Status New     PT LONG TERM GOAL #4   Title Patient will have an improvement in FOTO functional outcome score from 51% to 33% indicating improved function with less pain   Baseline Eval 33% limitation    Time 6   Period Weeks   Status New     PT LONG TERM GOAL #5   Title Pt will be able to sit for 1 hour without exacerbation of pain to perform work duties as an Airline pilot and incorporate stretching and rest breaks in day   Time 6   Period Weeks   Status New     PT LONG TERM GOAL #6   Title Pt will be able to increase AROM in shoulders and neck  to perform cooking and ADL/ household chores such as washing tub without exacerbating pain    Time 6   Period Weeks   Status New                Plan - 01/16/17 1610    Clinical Impression Statement 51 yo female with chronic left shoulder pain and meralgia paresthetica, DDD presents  with a decade long history of neck and left scapular, upper trap and levator pain.  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  She feels  pain has inceased and thinks she may be having poor posture to accomodate pain.  No incident has caused increased pain but has a long history of neck/ DDD pain.  Symptoms have worsend over the last few months.  worse with sitting long hours required for work.  pt with rounded shoulders and forward head.  Decreased left Shoulder and cervical AROM, (( See flowsheet) Pt has recieved relief from PT in the past and with trigger point dry needling.  Pt will benefit from skilled PT to address these impairments of ROM, pain, posture and strength.   Clinical Presentation Evolving   Clinical Presentation due to: pt with decreased AROM of cervical and shoulder left and increasing pain and decreased ability to sit and stand for 30 minutes   Clinical Decision Making Moderate   Rehab Potential Excellent   PT Frequency 2x / week   PT Duration 6 weeks   PT Treatment/Interventions ADLs/Self Care Home Management;Taping;Dry needling;Manual techniques;Passive range of motion;Patient/family education;Neuromuscular re-education;Therapeutic exercise;Moist Heat;Ultrasound;Traction;Cryotherapy;Electrical Stimulation;Iontophoresis 4mg /ml Dexamethasone   PT Next Visit Plan upper trap, levator stretchs and scapular strengthening/ posture /body mechanic training   PT Home Exercise Plan Dry needling precautians and home handout, intial sitting and standing posture   Consulted and Agree with Plan of Care Patient  Patient will benefit from skilled therapeutic intervention in order to improve the following deficits and impairments:  Pain, Impaired UE functional use, Postural dysfunction, Improper body mechanics, Decreased strength, Decreased range of motion, Increased muscle spasms  Visit Diagnosis: Chronic pain in left shoulder  Muscle spasm of back  Abnormal posture  Muscle weakness (generalized)     Problem List Patient Active Problem List   Diagnosis Date Noted  .  Hyperlipidemia LDL goal <130 11/28/2016  . Hyperglycemia 11/28/2016  . Allergic rhinitis due to pollen 10/14/2015  . Right thyroid nodule 05/17/2015  . DDD (degenerative disc disease), cervical 05/03/2015  . GAD (generalized anxiety disorder) 05/03/2015  . Meralgia paresthetica of left side 10/21/2014  . Carpal tunnel syndrome, right 09/01/2014  . Essential hypertension 07/24/2014  . Routine general medical examination at a health care facility 07/24/2014    Garen Lah, PT Certified Exercise Expert for the Aging Adult  01/16/17 11:22 AM Phone: [phone] Fax: [phone]  [facility] [address] [place], [place], [address] Phone: [phone]   Fax:  [phone]  Name: CLARENE CURRAN MRN: [id number] Date of Birth: 1966/07/09

## 2017-01-23 ENCOUNTER — Ambulatory Visit: Payer: Managed Care, Other (non HMO) | Admitting: Physical Therapy

## 2017-01-23 DIAGNOSIS — M5030 Other cervical disc degeneration, unspecified cervical region: Secondary | ICD-10-CM

## 2017-01-23 DIAGNOSIS — M436 Torticollis: Secondary | ICD-10-CM

## 2017-01-23 DIAGNOSIS — M25512 Pain in left shoulder: Secondary | ICD-10-CM | POA: Diagnosis not present

## 2017-01-23 DIAGNOSIS — R293 Abnormal posture: Secondary | ICD-10-CM

## 2017-01-23 DIAGNOSIS — M6281 Muscle weakness (generalized): Secondary | ICD-10-CM

## 2017-01-23 DIAGNOSIS — G8929 Other chronic pain: Secondary | ICD-10-CM

## 2017-01-23 DIAGNOSIS — M62830 Muscle spasm of back: Secondary | ICD-10-CM

## 2017-01-23 NOTE — Therapy (Signed)
[facility] [address] [place], [place], [address] Phone: [phone]   Fax:  [phone]  Physical Therapy Treatment  Patient Details  Name: Hannah Miranda MRN: [id number] Date of Birth: 16-Feb-1966 Referring Provider: Dione Plover MD  Encounter Date: 01/23/2017      PT End of Session - 01/23/17 0850    Visit Number 2   Number of Visits 12   Date for PT Re-Evaluation 02/27/17   Authorization Type [employer]   PT Start Time [phone]   PT Stop Time 0940   PT Time Calculation (min) 57 min   Activity Tolerance Patient tolerated treatment well   Behavior During Therapy [facility] for tasks assessed/performed      Past Medical History:  Diagnosis Date  . Anxiety   . Depression   . GERD (gastroesophageal reflux disease)   . Hypertension     No past surgical history on file.  There were no vitals filed for this visit.      Subjective Assessment - 01/23/17 0852    Subjective I was a little sore but it only lasted 24 hours. I used a hot pad and I could feel that it loosened up.      Pertinent History DDD, HTN, cervical DDD, carpal tunnel, meralgia paresthetica   Limitations Lifting;House hold activities   How long can you sit comfortably? 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 Yes   Pain Score 3    Pain Location Shoulder   Pain Orientation Left   Pain Descriptors / Indicators Spasm;Tingling;Pressure   Pain Type Chronic pain   Pain Onset More than a month ago   Pain Frequency Intermittent                         [facility] Adult PT Treatment/Exercise - 01/23/17 0854      Shoulder Exercises: Stretch   Corner Stretch 3 reps;30 seconds   Corner Stretch Limitations at 45 degrees and 90 degree abduction in the corner     Modalities   Modalities Moist Heat      Moist Heat Therapy   Number Minutes Moist Heat 15 Minutes   Moist Heat Location Cervical;Shoulder  left     Manual Therapy   Manual Therapy Soft tissue mobilization;Joint mobilization   Joint Mobilization PA lateral upa on left grade 3/4 at C-2 to C-5   Soft tissue mobilization left upper trap and levator and medial periscapular muscles and left scalenes     Neck Exercises: Stretches   Upper Trapezius Stretch 3 reps;30 seconds   Upper Trapezius Stretch Limitations Pt with VC/tc for gentle stretch   Levator Stretch 3 reps;30 seconds   Levator Stretch Limitations VC/tc for gentle stretch   Neck Stretch 3 reps;30 seconds   Neck Stretch Limitations scalene and deep neck stretch each          Trigger Point Dry Needling - 01/23/17 0904    Consent Given? Yes   Education Handout Provided No   Muscles Treated Upper Body Levator scapulae;Upper trapezius;Subscapularis;Rhomboids  left side only    Upper Trapezius Response Twitch reponse elicited;Palpable increased muscle length   Levator Scapulae Response Twitch response elicited;Palpable increased muscle length   Rhomboids Response Twitch response elicited;Palpable increased muscle length  Subscapularis Response Twitch response elicited;Palpable increased muscle length              PT Education - 01/23/17 0902    Education provided Yes   Education Details neck stretches VC and TC, HEP pec stretches   Person(s) Educated Patient   Methods Explanation;Demonstration;Tactile cues;Verbal cues;Handout   Comprehension Verbalized understanding;Returned demonstration          PT Short Term Goals - 01/16/17 0825      PT SHORT TERM GOAL #1   Title STG=LTG           PT Long Term Goals - 01/16/17 0825      PT LONG TERM GOAL #1   Title Patient will be independent in self management through postural correction and safe self progression of HEP  08/06/15   Time 6   Period Weeks   Status New     PT LONG TERM GOAL #2    Title Cervical extension, sidebending and rotation improved to grossly 50 degrees needed for ADLS and driving with greater ease.   Time 6   Period Weeks   Status New     PT LONG TERM GOAL #3   Title Deep cervical flexor and extensor strength as well as periscapular muscle strength grossly 4+/5 to 5-/5.   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 Hannah SiHolz Thayer Ohm( Chris) returns to clinic with 3/10 pain and reports better movement with work and driving.  Pt was given neck stretches to augment manual therapy and progress toward goals. will continue with RX and assess goals next visit   Rehab Potential Excellent   PT Frequency 2x / week   PT Duration 6 weeks   PT Treatment/Interventions ADLs/Self Care Home Management;Taping;Dry needling;Manual techniques;Passive range of motion;Patient/family education;Neuromuscular re-education;Therapeutic exercise;Moist Heat;Ultrasound;Traction;Cryotherapy;Electrical Stimulation;Iontophoresis 4mg /ml Dexamethasone   PT Next Visit Plan upper trap, levator stretchs and scapular strengthening/ posture /body mechanic training   PT Home Exercise Plan Dry needling precautians and home handout, intial sitting and standing posture, neck  stretches and pec stretches   Consulted and Agree with Plan of Care Patient      Patient will benefit from skilled therapeutic intervention in order to improve the following deficits and impairments:  Pain, Impaired UE functional use, Postural dysfunction, Improper body mechanics, Decreased strength, Decreased range of motion, Increased muscle spasms  Visit Diagnosis: Chronic pain in left shoulder  Muscle spasm of back  Muscle weakness (generalized)  Abnormal posture  DDD (degenerative disc disease), cervical  Stiffness of cervical spine  Muscle weakness     Problem List Patient Active Problem List   Diagnosis Date Noted  . Hyperlipidemia LDL goal <130 11/28/2016  . Hyperglycemia 11/28/2016  . Allergic rhinitis due to pollen 10/14/2015  . Right thyroid nodule 05/17/2015  . DDD (degenerative disc disease), cervical 05/03/2015  . GAD (generalized anxiety disorder) 05/03/2015  . Meralgia paresthetica of left side 10/21/2014  . Carpal tunnel syndrome, right 09/01/2014  . Essential hypertension 07/24/2014  . Routine general medical examination at a health care facility 07/24/2014   Garen Lah, PT Certified Exercise Expert for the Aging Adult  01/23/17 12:00 PM Phone: [phone] Fax: [phone]  [facility] [address] [place], [place], [address] Phone: [phone]   Fax:  [phone]  Name: Hannah Miranda MRN: [id number] Date of Birth: 05-08-1966

## 2017-01-23 NOTE — Patient Instructions (Signed)
   Garen LahLawrie Beardsley, PT Certified Exercise Expert for the Aging Adult  01/23/17 9:02 AM Phone: [phone] Fax: [phone]

## 2017-01-25 ENCOUNTER — Ambulatory Visit: Payer: Managed Care, Other (non HMO) | Admitting: Physical Therapy

## 2017-01-25 DIAGNOSIS — R293 Abnormal posture: Secondary | ICD-10-CM

## 2017-01-25 DIAGNOSIS — M62830 Muscle spasm of back: Secondary | ICD-10-CM

## 2017-01-25 DIAGNOSIS — M6281 Muscle weakness (generalized): Secondary | ICD-10-CM

## 2017-01-25 DIAGNOSIS — M25512 Pain in left shoulder: Principal | ICD-10-CM

## 2017-01-25 DIAGNOSIS — M436 Torticollis: Secondary | ICD-10-CM

## 2017-01-25 DIAGNOSIS — G8929 Other chronic pain: Secondary | ICD-10-CM

## 2017-01-25 DIAGNOSIS — M5030 Other cervical disc degeneration, unspecified cervical region: Secondary | ICD-10-CM

## 2017-01-25 NOTE — Patient Instructions (Signed)
Sleeping on Back  Place pillow under knees. A pillow with cervical support and a roll around waist are also helpful. Copyright  VHI. All rights reserved.  Sleeping on Side Place pillow between knees. Use cervical support under neck and a roll around waist as needed. Copyright  VHI. All rights reserved.   Sleeping on Stomach   If this is the only desirable sleeping position, place pillow under lower legs, and under stomach or chest as needed.  Posture - Sitting   Sit upright, head facing forward. Try using a roll to support lower back. Keep shoulders relaxed, and avoid rounded back. Keep hips level with knees. Avoid crossing legs for long periods. Stand to Sit / Sit to Stand   To sit: Bend knees to lower self onto front edge of chair, then scoot back on seat. To stand: Reverse sequence by placing one foot forward, and scoot to front of seat. Use rocking motion to stand up.   Work Height and Reach  Ideal work height is no more than 2 to 4 inches below elbow level when standing, and at elbow level when sitting. Reaching should be limited to arm's length, with elbows slightly bent.  Bending  Bend at hips and knees, not back. Keep feet shoulder-width apart.    Posture - Standing   Good posture is important. Avoid slouching and forward head thrust. Maintain curve in low back and align ears over shoul- ders, hips over ankles.  Alternating Positions   Alternate tasks and change positions frequently to reduce fatigue and muscle tension. Take rest breaks. Computer Work   Position work to face forward. Use proper work and seat height. Keep shoulders back and down, wrists straight, and elbows at right angles. Use chair that provides full back support. Add footrest and lumbar roll as needed.  Getting Into / Out of Car  Lower self onto seat, scoot back, then bring in one leg at a time. Reverse sequence to get out.  Dressing  Lie on back to pull socks or slacks over feet, or sit  and bend leg while keeping back straight.    Housework - Sink  Place one foot on ledge of cabinet under sink when standing at sink for prolonged periods.   Pushing / Pulling  Pushing is preferable to pulling. Keep back in proper alignment, and use leg muscles to do the work.  Deep Squat   Squat and lift with both arms held against upper trunk. Tighten stomach muscles without holding breath. Use smooth movements to avoid jerking.  Avoid Twisting   Avoid twisting or bending back. Pivot around using foot movements, and bend at knees if needed when reaching for articles.  Carrying Luggage   Distribute weight evenly on both sides. Use a cart whenever possible. Do not twist trunk. Move body as a unit.   Lifting Principles .Maintain proper posture and head alignment. .Slide object as close as possible before lifting. .Move obstacles out of the way. .Test before lifting; ask for help if too heavy. .Tighten stomach muscles without holding breath. .Use smooth movements; do not jerk. .Use legs to do the work, and pivot with feet. .Distribute the work load symmetrically and close to the center of trunk. .Push instead of pull whenever possible.   Ask For Help   Ask for help and delegate to others when possible. Coordinate your movements when lifting together, and maintain the low back curve.  Log Roll   Lying on back, bend left knee and place left   arm across chest. Roll all in one movement to the right. Reverse to roll to the left. Always move as one unit. Housework - Sweeping  Use long-handled equipment to avoid stooping.   Housework - Wiping  Position yourself as close as possible to reach work surface. Avoid straining your back.  Laundry - Unloading Wash   To unload small items at bottom of washer, lift leg opposite to arm being used to reach.  Gardening - Raking  Move close to area to be raked. Use arm movements to do the work. Keep back straight and avoid  twisting.     Cart  When reaching into cart with one arm, lift opposite leg to keep back straight.   Getting Into / Out of Bed  Lower self to lie down on one side by raising legs and lowering head at the same time. Use arms to assist moving without twisting. Bend both knees to roll onto back if desired. To sit up, start from lying on side, and use same move-ments in reverse. Housework - Vacuuming  Hold the vacuum with arm held at side. Step back and forth to move it, keeping head up. Avoid twisting.   Laundry - Armed forces training and education officerLoading Wash  Position laundry basket so that bending and twisting can be avoided.   Laundry - Unloading Dryer  Squat down to reach into clothes dryer or use a reacher.  Gardening - Weeding / Psychiatric nurselanting  Squat or Kneel. Knee pads may be helpful.                    Over Head Pull: Narrow Grip       On back, knees bent, feet flat, band across thighs, elbows straight but relaxed. Pull hands apart (start). Keeping elbows straight, bring arms up and over head, hands toward floor. Keep pull steady on band. Hold momentarily. Return slowly, keeping pull steady, back to start. Repeat _20__ times. Band color red______   Side Pull: Double Arm   On back, knees bent, feet flat. Arms perpendicular to body, shoulder level, elbows straight but relaxed. Pull arms out to sides, elbows straight. Resistance band comes across collarbones, hands toward floor. Hold momentarily. Slowly return to starting position. Repeat 20___ times. Band color _red____   Elisabeth CaraSash   On back, knees bent, feet flat, left hand on left hip, right hand above left. Pull right arm DIAGONALLY (hip to shoulder) across chest. Bring right arm along head toward floor. Hold momentarily. Slowly return to starting position. Repeat _20__ times. Do with left arm. Band color __red____   Shoulder Rotation: Double Arm   On back, knees bent, feet flat, elbows tucked at sides, bent 90, hands palms up. Pull hands  apart and down toward floor, keeping elbows near sides. Hold momentarily. Slowly return to starting position. Repeat _20__ times. Band color __red____   Garen LahLawrie Beardsley, PT Certified Exercise Expert for the Aging Adult  01/25/17 8:51 AM Phone: [phone] Fax: [phone]

## 2017-01-25 NOTE — Therapy (Signed)
[facility] [address] [place], [place], [address] Phone: [phone]   Fax:  [phone]  Physical Therapy Treatment  Patient Details  Name: Hannah Miranda MRN: [id number] Date of Birth: 1966-07-26 Referring Provider: Dione PloverJone, Thomas L MD  Encounter Date: 01/25/2017      PT End of Session - 01/25/17 0845    Visit Number 3   Number of Visits 12   Date for PT Re-Evaluation 02/27/17   Authorization Type [employer]   PT Start Time 0845   PT Stop Time 0940   PT Time Calculation (min) 55 min   Activity Tolerance Patient tolerated treatment well   Behavior [facility] Therapy [facility] for tasks assessed/performed      Past Medical History:  Diagnosis Date  . Anxiety   . Depression   . GERD (gastroesophageal reflux disease)   . Hypertension     No past surgical history on file.  There were no vitals filed for this visit.      Subjective Assessment - 01/25/17 0845    Subjective I was a little sore when I left but it didn;t last in soreness like the first time.  I now leave a hot pack for my neck at work.   Pertinent History DDD, HTN, cervical DDD, carpal tunnel, meralgia paresthetica   Limitations Lifting;House hold activities   How long can you sit comfortably? 45 minutes to 1 hour to do accounting work.  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)   Pain Score 3    Pain Orientation Left   Pain Descriptors / Indicators Pressure;Tightness   Pain Type Chronic pain   Pain Onset More than a month ago   Pain Frequency Intermittent            [facility] PT Assessment - 01/25/17 0907      AROM   Cervical Flexion 56   Cervical Extension 68   Cervical - Right Side Bend 50   Cervical - Left Side Bend 52   Cervical - Right Rotation 60   Cervical - Left Rotation 55                      [facility] Adult PT Treatment/Exercise - 01/25/17 0853      Self-Care   Self-Care Posture;Lifting;ADL's   ADL's went over handout verbally and voiced understanding   Lifting demo proper lifting of 8 lb stool   Posture verbalized understanding of point on handout with sleeping, household chores     Shoulder Exercises: Supine   Other Supine Exercises shoulder ext isometric x 15 bil with 5 sec hold   Other Supine Exercises supine scapular stabilizer with red t band overhead pull narrow grip , horizontal abduction, diagonals and ER bil      Moist Heat Therapy   Number Minutes Moist Heat 15 Minutes   Moist Heat Location Cervical;Shoulder  left     Manual Therapy   Manual Therapy Soft tissue mobilization;Joint mobilization   Joint Mobilization PA lateral upa on left grade 3/4 at C-2 to C-5   Soft tissue mobilization left upper trap and levator and medial periscapular muscles and left scalenes          Trigger Point Dry Needling - 01/25/17 0914    Consent Given? Yes  Education Handout Provided No  previously given   Muscles Treated Upper Body Levator scapulae;Upper trapezius  scalene all Left only for all needling   Upper Trapezius Response Twitch reponse elicited;Palpable increased muscle length   Levator Scapulae Response Twitch response elicited;Palpable increased muscle length              PT Education - 01/25/17 0851    Education provided Yes   Education Details went over ADL's and body mechanics and gave strength in supine with red t band   Person(s) Educated Patient   Methods Explanation;Demonstration;Tactile cues;Verbal cues;Handout   Comprehension Verbalized understanding;Returned demonstration          PT Short Term Goals - 01/16/17 0825      PT SHORT TERM GOAL #1   Title STG=LTG           PT Long Term Goals - 01/25/17 0909      PT LONG TERM GOAL #1   Title Patient will be independent in self management through  postural correction and safe self progression of HEP  08/06/15   Baseline Pt instructed in ADL and body mechanics today   Time 6   Period Weeks   Status On-going     PT LONG TERM GOAL #2   Title Cervical extension, sidebending and rotation improved to grossly 50 degrees needed for ADLS and driving with greater ease.   Baseline right side bend 50 left SB 52   Time 6   Period Weeks   Status On-going     PT LONG TERM GOAL #3   Title Deep cervical flexor and extensor strength as well as periscapular muscle strength grossly 4+/5 to 5-/5.   Baseline given scapular stabilizers for strength with red tband   Time 6   Period Weeks   Status On-going     PT LONG TERM GOAL #4   Title Patient will have an improvement in FOTO functional outcome score from 51% to 33% indicating improved function with less pain   Baseline Eval 33% limitation    Time 6   Period Weeks   Status Unable to assess     PT LONG TERM GOAL #5   Title Pt will be able to sit for 1 hour without exacerbation of pain to perform work duties as an Airline pilot and incorporate stretching and rest breaks in day   Baseline Pt able to sit for 45 min up to 1 hour   Status On-going     PT LONG TERM GOAL #6   Title Pt will be able to increase AROM in shoulders and neck  to perform cooking and ADL/ household chores such as washing tub without exacerbating pain    Baseline Pt has not been able to wash tub   Time 6   Period Weeks   Status Unable to assess               Plan - 01/25/17 0909    Clinical Impression Statement [name] has better AROM of neck. See flowsheet today.  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  Pt has been educated on body mechanics and posture and ADL proper posture   Rehab Potential Excellent   PT Frequency 2x / week   PT Duration 6 weeks   PT Treatment/Interventions ADLs/Self Care Home Management;Taping;Dry needling;Manual techniques;Passive  range of motion;Patient/family education;Neuromuscular re-education;Therapeutic exercise;Moist Heat;Ultrasound;Traction;Cryotherapy;Electrical Stimulation;Iontophoresis 4mg /ml Dexamethasone   PT  Next Visit Plan upper trap, levator stretchs and scapular strengthening/ posture /body mechanic training   PT Home Exercise Plan Dry needling precautians and home handout, intial sitting and standing posture, neck stretches and pec stretches supine scapular stabilizers   Consulted and Agree with Plan of Care Patient      Patient will benefit from skilled therapeutic intervention in order to improve the following deficits and impairments:  Pain, Impaired UE functional use, Postural dysfunction, Improper body mechanics, Decreased strength, Decreased range of motion, Increased muscle spasms  Visit Diagnosis: Chronic pain in left shoulder  Muscle spasm of back  Muscle weakness (generalized)  Abnormal posture  DDD (degenerative disc disease), cervical  Stiffness of cervical spine  Muscle weakness     Problem List Patient Active Problem List   Diagnosis Date Noted  . Hyperlipidemia LDL goal <130 11/28/2016  . Hyperglycemia 11/28/2016  . Allergic rhinitis due to pollen 10/14/2015  . Right thyroid nodule 05/17/2015  . DDD (degenerative disc disease), cervical 05/03/2015  . GAD (generalized anxiety disorder) 05/03/2015  . Meralgia paresthetica of left side 10/21/2014  . Carpal tunnel syndrome, right 09/01/2014  . Essential hypertension 07/24/2014  . Routine general medical examination at a health care facility 07/24/2014    Garen Lah, PT Certified Exercise Expert for the Aging Adult  01/25/17 9:32 AM Phone: [phone] Fax: [phone]  [facility] [address] [place], [place], [address] Phone: [phone]   Fax:  [phone]  Name: Hannah Miranda MRN: [id number] Date of Birth: 1965/09/20

## 2017-01-30 ENCOUNTER — Encounter: Payer: Self-pay | Admitting: Physical Therapy

## 2017-01-30 ENCOUNTER — Ambulatory Visit: Payer: Managed Care, Other (non HMO) | Admitting: Physical Therapy

## 2017-01-30 DIAGNOSIS — M25512 Pain in left shoulder: Principal | ICD-10-CM

## 2017-01-30 DIAGNOSIS — M6281 Muscle weakness (generalized): Secondary | ICD-10-CM

## 2017-01-30 DIAGNOSIS — G8929 Other chronic pain: Secondary | ICD-10-CM

## 2017-01-30 DIAGNOSIS — M62830 Muscle spasm of back: Secondary | ICD-10-CM

## 2017-01-30 DIAGNOSIS — R293 Abnormal posture: Secondary | ICD-10-CM

## 2017-01-30 DIAGNOSIS — M436 Torticollis: Secondary | ICD-10-CM

## 2017-01-30 DIAGNOSIS — M5030 Other cervical disc degeneration, unspecified cervical region: Secondary | ICD-10-CM

## 2017-01-30 NOTE — Therapy (Signed)
[facility] [address] [place], [place], [address] Phone: [phone]   Fax:  [phone]  Physical Therapy Treatment  Patient Details  Name: Hannah Miranda MRN: [id number] Date of Birth: 07/04/1966 Referring Provider: Dione Plover MD  Encounter Date: 01/30/2017      PT End of Session - 01/30/17 0852    Visit Number 4   Number of Visits 12   Date for PT Re-Evaluation 02/27/17   Authorization Type [employer]   PT Start Time [phone]   PT Stop Time 0945   PT Time Calculation (min) 58 min   Activity Tolerance Patient tolerated treatment well   Behavior During Therapy [facility] for tasks assessed/performed      Past Medical History:  Diagnosis Date  . Anxiety   . Depression   . GERD (gastroesophageal reflux disease)   . Hypertension     History reviewed. No pertinent surgical history.  There were no vitals filed for this visit.      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 Yes   Pain Score 3    Pain Location Shoulder   Pain Orientation Left   Pain Descriptors / Indicators Tightness;Burning;Tingling   Pain Type Chronic pain   Pain Onset More than a month ago                         [facility] Adult PT Treatment/Exercise - 01/30/17 0856      Shoulder Exercises: Supine   Other Supine Exercises shoulder ext isometric x 15 bil with 5 sec hold  bil at 5 and 3 o clock   Other Supine Exercises wall clock      Shoulder Exercises: Stretch   Corner Stretch 3 reps;30 seconds   Corner Stretch Limitations at 45 degrees and 90 degree abduction in the corner     Modalities   Modalities Moist Heat     Moist Heat Therapy   Moist Heat Location  Cervical;Shoulder  left     Manual Therapy   Manual Therapy Soft tissue mobilization;Joint mobilization   Joint Mobilization PA lateral upa on left grade 3/4 at C-2 to C-5   Soft tissue mobilization left upper trap and levator and medial periscapular muscles and bil scalenes          Trigger Point Dry Needling - 01/30/17 0858    Consent Given? Yes   Education Handout Provided No   Muscles Treated Upper Body Levator scapulae;Upper trapezius;Rhomboids;Subscapularis;Supraspinatus;Infraspinatus  on left side only except Levator and Upper trap bil r scalen   Upper Trapezius Response Twitch reponse elicited;Palpable increased muscle length   Levator Scapulae Response Twitch response elicited;Palpable increased muscle length   Rhomboids Response Twitch response elicited;Palpable increased muscle length   Supraspinatus Response Palpable increased muscle length   Infraspinatus Response Palpable increased muscle length   Subscapularis Response Twitch response elicited;Palpable increased muscle length                PT Short Term Goals - 01/16/17 0825      PT SHORT TERM GOAL #1   Title STG=LTG           PT Long Term  Goals - 01/25/17 0909      PT LONG TERM GOAL #1   Title Patient will be independent in self management through postural correction and safe self progression of HEP  08/06/15   Baseline Pt instructed in ADL and body mechanics today   Time 6   Period Weeks   Status On-going     PT LONG TERM GOAL #2   Title Cervical extension, sidebending and rotation improved to grossly 50 degrees needed for ADLS and driving with greater ease.   Baseline right side bend 50 left SB 52   Time 6   Period Weeks   Status On-going     PT LONG TERM GOAL #3   Title Deep cervical flexor and extensor strength as well as periscapular muscle strength grossly 4+/5 to 5-/5.   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  Pt helped husband with building deck handing him wood pieces and being mindful of proper posture and body mechanics.  Pt reports better driving with more AROM of cervical    Rehab Potential Excellent   PT Frequency 2x / week   PT Duration 6 weeks   PT Treatment/Interventions ADLs/Self Care Home Management;Taping;Dry needling;Manual techniques;Passive range of motion;Patient/family education;Neuromuscular re-education;Therapeutic exercise;Moist Heat;Ultrasound;Traction;Cryotherapy;Electrical Stimulation;Iontophoresis 4mg /ml Dexamethasone   PT Next Visit Plan progress scapular strengthening with physioball and/or standing scapular , manual for rhomboids/ scalenes as needed   PT Home Exercise Plan Dry needling precautians and home handout, intial sitting and standing posture, neck stretches and pec stretches supine scapular stabilizers, scapular clock   Consulted and Agree with Plan of Care Patient      Patient will benefit from skilled therapeutic intervention in order to improve the following deficits and  impairments:  Pain, Impaired UE functional use, Postural dysfunction, Improper body mechanics, Decreased strength, Decreased range of motion, Increased muscle spasms  Visit Diagnosis: Chronic pain in left shoulder  Muscle spasm of back  Muscle weakness (generalized)  Abnormal posture  DDD (degenerative disc disease), cervical  Stiffness of cervical spine  Muscle weakness     Problem List Patient Active Problem List   Diagnosis Date Noted  . Hyperlipidemia LDL goal <130 11/28/2016  . Hyperglycemia 11/28/2016  .  Allergic rhinitis due to pollen 10/14/2015  . Right thyroid nodule 05/17/2015  . DDD (degenerative disc disease), cervical 05/03/2015  . GAD (generalized anxiety disorder) 05/03/2015  . Meralgia paresthetica of left side 10/21/2014  . Carpal tunnel syndrome, right 09/01/2014  . Essential hypertension 07/24/2014  . Routine general medical examination at a health care facility 07/24/2014    Garen Lah, PT Certified Exercise Expert for the Aging Adult  01/30/17 9:30 AM Phone: [phone] Fax: [phone]  [facility] [address] [place], [place], [address] Phone: [phone]   Fax:  [phone]  Name: HAEDYN BREAU MRN: [id number] Date of Birth: 06/19/1966

## 2017-02-01 ENCOUNTER — Encounter: Payer: Self-pay | Admitting: Physical Therapy

## 2017-02-01 ENCOUNTER — Ambulatory Visit: Payer: Managed Care, Other (non HMO) | Admitting: Physical Therapy

## 2017-02-01 DIAGNOSIS — M5030 Other cervical disc degeneration, unspecified cervical region: Secondary | ICD-10-CM

## 2017-02-01 DIAGNOSIS — G8929 Other chronic pain: Secondary | ICD-10-CM

## 2017-02-01 DIAGNOSIS — R293 Abnormal posture: Secondary | ICD-10-CM

## 2017-02-01 DIAGNOSIS — M62830 Muscle spasm of back: Secondary | ICD-10-CM

## 2017-02-01 DIAGNOSIS — M25512 Pain in left shoulder: Principal | ICD-10-CM

## 2017-02-01 DIAGNOSIS — M6281 Muscle weakness (generalized): Secondary | ICD-10-CM

## 2017-02-01 DIAGNOSIS — M436 Torticollis: Secondary | ICD-10-CM

## 2017-02-01 NOTE — Therapy (Signed)
[facility] [address] [place], [place], [address] Phone: [phone]   Fax:  [phone]  Physical Therapy Treatment  Patient Details  Name: Hannah Miranda MRN: [id number] Date of Birth: 1966-05-12 Referring Provider: Dione PloverJone, Thomas L MD  Encounter Date: 02/01/2017      PT End of Session - 02/01/17 1021    Visit Number 5   Number of Visits 12   Date for PT Re-Evaluation 02/27/17   PT Start Time 0933   PT Stop Time 1012   PT Time Calculation (min) 39 min   Activity Tolerance Patient tolerated treatment well   Behavior During Therapy [facility] for tasks assessed/performed      Past Medical History:  Diagnosis Date  . Anxiety   . Depression   . GERD (gastroesophageal reflux disease)   . Hypertension     History reviewed. No pertinent surgical history.  There were no vitals filed for this visit.      Subjective Assessment - 02/01/17 0937    Subjective DN has not spasmed since last session.  I spot in scapula nerve area.  I am so thankful. Found a lace at work where she can usr  the wall to exercise.    Pain Score 1    Pain Location Shoulder   Pain Orientation Left                         [facility] Adult PT Treatment/Exercise - 02/01/17 0001      Self-Care   Posture sitting posture discussed and practiced.  4 inch pltform under feet,  Practiced sitting unsupported.     Shoulder Exercises: Prone   Extension 10 reps   Extension Limitations cued   Other Prone Exercises T, Y with thumbs forward and reverse. 5-10 x each     Shoulder Exercises: Standing   Other Standing Exercises Wall push ups 10 X cued initially     Shoulder Exercises: Stretch   Cross Chest Stretch 2 reps;20 seconds   Cross Chest Stretch Limitations post manual   Other Shoulder Stretches Arm opener from right side in hopes of getting facets to loosen at bra line,  5 X     Manual Therapy   Soft tissue mobilization LT paraspinals, upper  trap and rhomboid,  unable to get bra line tension to release.  , other tissues softened.                 PT Education - 02/01/17 1020    Education provided Yes   Education Details Posture   Person(s) Educated Patient   Methods Explanation;Demonstration;Verbal cues   Comprehension Verbalized understanding;Returned demonstration          PT Short Term Goals - 01/16/17 0825      PT SHORT TERM GOAL #1   Title STG=LTG           PT Long Term Goals - 01/25/17 0909      PT LONG TERM GOAL #1   Title Patient will be independent in self management through postural correction and safe self progression of HEP  08/06/15   Baseline Pt instructed in ADL and body mechanics today   Time 6   Period Weeks   Status On-going     PT LONG TERM GOAL #2   Title Cervical extension, sidebending and rotation improved to grossly 50 degrees needed for ADLS and driving with greater ease.   Baseline right side bend 50 left SB  52   Time 6   Period Weeks   Status On-going     PT LONG TERM GOAL #3   Title Deep cervical flexor and extensor strength as well as periscapular muscle strength grossly 4+/5 to 5-/5.   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  She is independent with her HEP and is adherent. She is able to demo good sitting posture unsupported when asked.She noticed in the mirror that her "Fatty hump" in her upper back is gone.   PT Treatment/Interventions ADLs/Self Care Home Management;Taping;Dry needling;Manual techniques;Passive range of motion;Patient/family education;Neuromuscular re-education;Therapeutic exercise;Moist Heat;Ultrasound;Traction;Cryotherapy;Electrical Stimulation;Iontophoresis 4mg /ml Dexamethasone   PT Next Visit Plan progress scapular strengthening with physioball and/or standing scapular , manual for rhomboids/ scalenes as needed   PT Home Exercise Plan Dry needling precautians and home handout, intial sitting and standing posture, neck stretches and pec stretches supine scapular stabilizers, scapular clock   Consulted and Agree with Plan of Care Patient      Patient will benefit from skilled therapeutic intervention in order to improve the following deficits and impairments:  Pain, Impaired UE functional use, Postural dysfunction, Improper body mechanics, Decreased strength, Decreased range of motion, Increased muscle spasms  Visit Diagnosis: Chronic pain in left shoulder  Muscle spasm of back  Muscle weakness (generalized)  Abnormal posture  DDD (degenerative disc disease), cervical  Stiffness of cervical spine  Muscle weakness     Problem List Patient Active Problem List   Diagnosis Date Noted  . Hyperlipidemia LDL goal <130 11/28/2016  . Hyperglycemia 11/28/2016  . Allergic rhinitis due to pollen 10/14/2015  . Right thyroid nodule 05/17/2015  . DDD (degenerative disc disease), cervical 05/03/2015  . GAD (generalized anxiety disorder) 05/03/2015  . Meralgia paresthetica of left side 10/21/2014  . Carpal  tunnel syndrome, right 09/01/2014  . Essential hypertension 07/24/2014  . Routine general medical examination at a health care facility 07/24/2014    [facility] PTA 02/01/2017, 10:25 AM  [facility] [address] [place], [place], [address] Phone: [phone]   Fax:  [phone]  Name: Hannah Miranda MRN: [id number] Date of Birth: 10/25/1965

## 2017-02-06 ENCOUNTER — Encounter: Payer: Self-pay | Admitting: Physical Therapy

## 2017-02-06 ENCOUNTER — Ambulatory Visit: Payer: Managed Care, Other (non HMO) | Attending: Internal Medicine | Admitting: Physical Therapy

## 2017-02-06 DIAGNOSIS — G8929 Other chronic pain: Secondary | ICD-10-CM | POA: Diagnosis present

## 2017-02-06 DIAGNOSIS — R293 Abnormal posture: Secondary | ICD-10-CM

## 2017-02-06 DIAGNOSIS — M5030 Other cervical disc degeneration, unspecified cervical region: Secondary | ICD-10-CM | POA: Insufficient documentation

## 2017-02-06 DIAGNOSIS — M62830 Muscle spasm of back: Secondary | ICD-10-CM | POA: Diagnosis present

## 2017-02-06 DIAGNOSIS — M6281 Muscle weakness (generalized): Secondary | ICD-10-CM | POA: Diagnosis present

## 2017-02-06 DIAGNOSIS — M436 Torticollis: Secondary | ICD-10-CM | POA: Diagnosis present

## 2017-02-06 DIAGNOSIS — M25512 Pain in left shoulder: Secondary | ICD-10-CM | POA: Insufficient documentation

## 2017-02-06 NOTE — Therapy (Addendum)
[facility] [address] [place], [place], [address] Phone: [phone]   Fax:  [phone]  Physical Therapy Treatment  Patient Details  Name: Hannah Miranda MRN: [id number] Date of Birth: 05/10/1966 Referring Provider: Dione PloverJone, Thomas L MD  Encounter Date: 02/06/2017      PT End of Session - 02/06/17 1044    Visit Number 6   Number of Visits 12   Date for PT Re-Evaluation 02/27/17   PT Start Time 0802   PT Stop Time 0845   PT Time Calculation (min) 43 min   Activity Tolerance Patient tolerated treatment well   Behavior [facility]      Past Medical History:  Diagnosis Date  . Anxiety   . Depression   . GERD (gastroesophageal reflux disease)   . Hypertension     History reviewed. No pertinent surgical history.  There were no vitals filed for this visit.      Subjective Assessment - 02/06/17 0806    Subjective Manual helps, no tingling /burning.since the last visit.  Exercises help.  other muscles surrounding are sore. Able to sit about an hour.   Currently in Pain? Yes   Pain Score 1    Pain Orientation Left   Pain Descriptors / Indicators Sore   Pain Type Chronic pain   Pain Frequency Intermittent   Aggravating Factors  sitting longer than an hour   Pain Relieving Factors figit, change of position                         [facility] Adult PT Treatment/Exercise - 02/06/17 0001      Neck Exercises: Seated   Neck Retraction Limitations review     Shoulder Exercises: Supine   Other Supine Exercises neck retraction 10 X 5 seconds over rolled towel   Other Supine Exercises supine scap series over rolled towel, red band 10 X  diagonals, ER, horizontal abd, narrow grip flexion 10 x each , cues     Shoulder Exercises: Lawyertretch   Corner Stretch Limitations reviewed   Cross Chest Stretch 1 rep     Manual Therapy   Manual Therapy Soft tissue mobilization;Scapular  mobilization   Manual therapy comments tissue softened, gentle stretches.  Patient ticklish.   Soft tissue mobilization LT paraspinals, upper trap and rhomboid, , other tissues softened.      Neck Exercises: Stretches   Chest Stretch 1 rep   Chest Stretch Limitations over log towel roll                PT Education - 02/06/17 1043    Education provided Yes   Education Details sitting posture   Person(s) Educated Patient   Methods Explanation   Comprehension Verbalized understanding;Returned demonstration          PT Short Term Goals - 01/16/17 0825      PT SHORT TERM GOAL #1   Title STG=LTG           PT Long Term Goals - 02/06/17 1048      PT LONG TERM GOAL #1   Title Patient will be independent in self management through postural correction and safe self progression of HEP  08/06/15   Baseline Progressing with self management   Time 6   Period Weeks   Status On-going     PT LONG TERM GOAL #2   Title Cervical extension, sidebending and rotation improved to grossly 50 degrees  needed for ADLS and driving with greater ease.   Time 6   Period Weeks   Status On-going     PT LONG TERM GOAL #3   Title Deep cervical flexor and extensor strength as well as periscapular muscle strength grossly 4+/5 to 5-/5.   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  She is compliant with her HEP and even invents exercises to do.     PT Treatment/Interventions ADLs/Self Care Home Management;Taping;Dry needling;Manual techniques;Passive range of motion;Patient/family education;Neuromuscular re-education;Therapeutic exercise;Moist Heat;Ultrasound;Traction;Cryotherapy;Electrical Stimulation;Iontophoresis 4mg /ml Dexamethasone   PT Next Visit Plan Wall clock,  Measure Cervical ROM,  check goals.   PT Home Exercise Plan Dry needling precautians and home handout, intial sitting and standing posture, neck stretches and pec stretches supine scapular stabilizers, scapular clock   Consulted and Agree with Plan of Care Patient      Patient will benefit from skilled therapeutic intervention in order to improve the following deficits and impairments:  Pain, Impaired UE functional use, Postural dysfunction, Improper body mechanics, Decreased strength, Decreased range of motion, Increased muscle spasms  Visit Diagnosis: Chronic pain in left shoulder  Muscle spasm of back  Muscle weakness (generalized)  Abnormal posture  DDD (degenerative disc disease), cervical  Stiffness of cervical spine  Muscle weakness     Problem List Patient Active Problem List   Diagnosis Date Noted  . Hyperlipidemia LDL goal <130 11/28/2016  . Hyperglycemia 11/28/2016  . Allergic rhinitis due to pollen 10/14/2015  . Right thyroid nodule 05/17/2015  . DDD (degenerative disc disease), cervical 05/03/2015  . GAD (generalized anxiety disorder) 05/03/2015  . Meralgia paresthetica of left side 10/21/2014  . Carpal tunnel syndrome, right 09/01/2014  . Essential hypertension 07/24/2014  . Routine general medical examination at a health care facility 07/24/2014    [facility] PTA 02/06/2017, 10:52  AM  [facility] [address] [place], [place], [address] Phone: [phone]   Fax:  [phone]  Name: Hannah Miranda MRN: [id number] Date of Birth: 1965-11-06

## 2017-02-08 ENCOUNTER — Ambulatory Visit: Payer: Managed Care, Other (non HMO) | Admitting: Physical Therapy

## 2017-02-13 ENCOUNTER — Ambulatory Visit: Payer: Managed Care, Other (non HMO) | Admitting: Physical Therapy

## 2017-02-13 ENCOUNTER — Encounter: Payer: Self-pay | Admitting: Physical Therapy

## 2017-02-13 DIAGNOSIS — M6281 Muscle weakness (generalized): Secondary | ICD-10-CM

## 2017-02-13 DIAGNOSIS — M25512 Pain in left shoulder: Principal | ICD-10-CM

## 2017-02-13 DIAGNOSIS — M5030 Other cervical disc degeneration, unspecified cervical region: Secondary | ICD-10-CM

## 2017-02-13 DIAGNOSIS — M62830 Muscle spasm of back: Secondary | ICD-10-CM

## 2017-02-13 DIAGNOSIS — M436 Torticollis: Secondary | ICD-10-CM

## 2017-02-13 DIAGNOSIS — G8929 Other chronic pain: Secondary | ICD-10-CM

## 2017-02-13 DIAGNOSIS — R293 Abnormal posture: Secondary | ICD-10-CM

## 2017-02-13 NOTE — Patient Instructions (Signed)
     Hannah Miranda, PT Certified Exercise Expert for the Aging Adult  02/13/17 8:50 AM Phone: [phone] Fax: [phone]

## 2017-02-13 NOTE — Therapy (Addendum)
[place], [place], [address] Phone: [phone]   Fax:  [phone]  Physical Therapy Treatment  Patient Details  Name: Hannah Miranda MRN: [id number] Date of Birth: 25-Oct-1965 Referring Provider: Loman Brooklyn MD  Encounter Date: 02/13/2017      PT End of Session - 02/13/17 0846    Visit Number 7   Number of Visits 12   Date for PT Re-Evaluation 02/27/17   Authorization Type [employer]   PT Start Time 0845   PT Stop Time 0940   PT Time Calculation (min) 55 min   Activity Tolerance Patient tolerated treatment well   Behavior During Therapy [facility] for tasks assessed/performed      Past Medical History:  Diagnosis Date  . Anxiety   . Depression   . GERD (gastroesophageal reflux disease)   . Hypertension     History reviewed. No pertinent surgical history.  There were no vitals filed for this visit.      Subjective Assessment - 02/13/17 0847    Subjective I am doing great.  I feel 90% better   Pertinent History DDD, HTN, cervical DDD, carpal tunnel, meralgia paresthetica   Limitations Lifting;House hold activities   How long can you sit comfortably? 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 No/denies   Pain Location Shoulder   Pain Orientation Left            [facility] PT Assessment - 02/13/17 0855      AROM   Left Shoulder Extension 30 Degrees   Left Shoulder Flexion 161 Degrees  no pain   Left Shoulder ABduction 153 Degrees  no pain   Left Shoulder Internal Rotation 58 Degrees  no pain   Left Shoulder External Rotation 95 Degrees   Left Shoulder Horizontal ABduction 130 Degrees   Cervical Flexion 71   Cervical Extension 68   Cervical - Right Side Bend 51   Cervical - Left Side Bend 53   Cervical - Right  Rotation 60   Cervical - Left Rotation 60                     [facility] Adult PT Treatment/Exercise - 02/13/17 0859      Shoulder Exercises: Prone   Extension 20 reps;Both   Extension Limitations on physioball with neck in good alignment chin down.   Other Prone Exercises physioball exercises with bil UE in T position for 20 x and Y position for 20 x and  bil W postion for 20 times with VC and TC for good alignment of neck      Moist Heat Therapy   Number Minutes Moist Heat 15 Minutes   Moist Heat Location Cervical     Manual Therapy   Manual Therapy Soft tissue mobilization;Joint mobilization   Joint Mobilization PA lateral upa on left grade 3/4 at C-2 to C-5   Soft tissue mobilization left upper trap and levator and medial periscapular muscles and bil scalenes          Trigger Point Dry Needling - 02/13/17 0907    Consent Given? Yes   Education Handout Provided --  verbally reviewed   Muscles Treated Upper Body Levator scapulae;Upper trapezius;Rhomboids;Subscapularis;Supraspinatus;Infraspinatus  left side only  Upper Trapezius Response Palpable increased muscle length;Twitch reponse elicited   Levator Scapulae Response Twitch response elicited;Palpable increased muscle length   Rhomboids Response Palpable increased muscle length   Supraspinatus Response Palpable increased muscle length   Infraspinatus Response Palpable increased muscle length   Subscapularis Response Twitch response elicited;Palpable increased muscle length              PT Education - 02/13/17 0851    Education provided Yes   Education Details added shoulder physioball exercises with neck in good position    Person(s) Educated Patient   Methods Explanation;Demonstration;Tactile cues;Verbal cues;Handout   Comprehension Verbalized understanding;Returned demonstration          PT Short Term Goals - 02/13/17 0913      PT SHORT TERM GOAL #1   Title STG=LTG           PT Long  Term Goals - 02/13/17 0908      PT LONG TERM GOAL #1   Title Patient will be independent in self management through postural correction and safe self progression of HEP  08/06/15   Baseline Pt given advanced physioball exercises for shoulder and neck today   Time 6   Period Weeks   Status On-going     PT LONG TERM GOAL #2   Title Cervical extension, sidebending and rotation improved to grossly 50 degrees needed for ADLS and driving with greater ease.   Baseline See flowsheet, AROM improved in all ranges only tightness not pain   Time 6   Period Weeks   Status Achieved     PT LONG TERM GOAL #3   Title Deep cervical flexor and extensor strength as well as periscapular muscle strength grossly 4+/5 to 5-/5.   Baseline given physioball exercise. 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  Pt reports being 90% better and feels she will be ready for DC in next 2 visits.  Pt utilizes good posture.  AROM in left shoulder increased in all ranges and only  tightness but no pain as of today.    Rehab Potential Excellent   PT Frequency 2x / week   PT Duration 6 weeks   PT Treatment/Interventions ADLs/Self  Care Home Management;Taping;Dry needling;Manual techniques;Passive range of motion;Patient/family education;Neuromuscular re-education;Therapeutic exercise;Moist Heat;Ultrasound;Traction;Cryotherapy;Electrical Stimulation;Iontophoresis 59m/ml Dexamethasone   PT Next Visit Plan Wall clock,  Review physioball shoulder exercises DO FOTO next visit Pt will DC this visit or last visit with LDonnetta Simpersfor Dry needling and after completion of goals.    PT Home Exercise Plan Dry needling precautians and home handout, intial sitting and standing posture, neck stretches and pec stretches supine scapular stabilizers, scapular clock, physioball exercises   Consulted and Agree with Plan of Care Patient      Patient will benefit from skilled therapeutic intervention in order to improve the following deficits and impairments:  Pain, Impaired UE functional use, Postural dysfunction, Improper body mechanics, Decreased strength, Decreased range of motion, Increased muscle spasms  Visit Diagnosis: Chronic pain in left shoulder  Muscle spasm of back  Muscle weakness (generalized)  Abnormal posture  DDD (degenerative disc disease), cervical  Stiffness of cervical spine  Muscle weakness     Problem List Patient Active Problem List   Diagnosis Date Noted  . Hyperlipidemia LDL goal <130 11/28/2016  . Hyperglycemia 11/28/2016  . Allergic rhinitis due to pollen 10/14/2015  . Right thyroid nodule 05/17/2015  . DDD (degenerative disc disease), cervical 05/03/2015  . GAD (generalized anxiety disorder) 05/03/2015  . Meralgia paresthetica of left side 10/21/2014  . Carpal tunnel syndrome, right 09/01/2014  . Essential hypertension 07/24/2014  . Routine general medical examination at a health care facility 07/24/2014   LVoncille Lo PT Certified Exercise  Expert for the Aging Adult  02/13/17 9:28 AM Phone: [phone]: 3[facility]G[place] [place] [address]Phone: [phone]  Fax:  [phone] Name: Hannah BESSEMRN: [id number] of Birth: [birth year]

## 2017-02-15 ENCOUNTER — Encounter: Payer: Self-pay | Admitting: Physical Therapy

## 2017-02-15 ENCOUNTER — Ambulatory Visit: Payer: Managed Care, Other (non HMO) | Admitting: Physical Therapy

## 2017-02-15 DIAGNOSIS — M436 Torticollis: Secondary | ICD-10-CM

## 2017-02-15 DIAGNOSIS — M62830 Muscle spasm of back: Secondary | ICD-10-CM

## 2017-02-15 DIAGNOSIS — R293 Abnormal posture: Secondary | ICD-10-CM

## 2017-02-15 DIAGNOSIS — M6281 Muscle weakness (generalized): Secondary | ICD-10-CM

## 2017-02-15 DIAGNOSIS — M25512 Pain in left shoulder: Principal | ICD-10-CM

## 2017-02-15 DIAGNOSIS — M5030 Other cervical disc degeneration, unspecified cervical region: Secondary | ICD-10-CM

## 2017-02-15 DIAGNOSIS — G8929 Other chronic pain: Secondary | ICD-10-CM

## 2017-02-15 NOTE — Patient Instructions (Signed)
Issued cervical stabilization II from exercise drawer. All issued.  To be done on the foam roller. 3-4 x a week May use light weights 10 x each

## 2017-02-15 NOTE — Therapy (Addendum)
[place], [place], [address] Phone: [phone]   Fax:  [phone]  Physical Therapy Treatment/Discharge Note  Patient Details  Name: Hannah Miranda MRN: [id number] Date of Birth: 08/26/1965 Referring Provider: Loman Brooklyn MD  Encounter Date: 02/15/2017      PT End of Session - 02/15/17 1332    Visit Number 8   Number of Visits 12   Date for PT Re-Evaluation 02/27/17   PT Start Time 0846   PT Stop Time 0932   PT Time Calculation (min) 46 min   Activity Tolerance Patient tolerated treatment well   Behavior During Therapy [facility] for tasks assessed/performed      Past Medical History:  Diagnosis Date  . Anxiety   . Depression   . GERD (gastroesophageal reflux disease)   . Hypertension     History reviewed. No pertinent surgical history.  There were no vitals filed for this visit.      Subjective Assessment - 02/15/17 0853    Subjective Able to stand 45 minutes at least.   i have ordered a 6 inch foam roller.  I have not gotten a ball yet.  No pain yesterday.   Currently in Pain? No/denies   Pain Descriptors / Indicators --  She is aware og the space in her back " memory pain"              [facility] PT Assessment - 02/15/17 0001      Observation/Other Assessments   Focus on Therapeutic Outcomes (FOTO)  69% ability, 31 % limitation                      [facility] Adult PT Treatment/Exercise - 02/15/17 0001      Shoulder Exercises: Supine   Other Supine Exercises Neck retraction with 2 LBs.  (cervical stab II)  HEP  circles,  shoulder flex/ext,  horizontal add/ abd amd bent k     Shoulder Exercises: Prone   Extension 20 reps;Both   Extension Limitations on ball   Other Prone Exercises physioball exercises with bil UE in T position for 20 x and Y position for 20 x and  bil W postion for 20 times with VC and TC for good alignment of neck      Manual Therapy   Soft tissue  mobilization paraspinal left upper trap  and RT/ Left                PT Education - 02/15/17 1331    Education provided Yes   Education Details HEP,  cervical stabilization with light weights. to do on foam roller.   Person(s) Educated Patient   Methods Explanation;Demonstration;Tactile cues;Verbal cues;Handout   Comprehension Verbalized understanding;Returned demonstration          PT Short Term Goals - 02/13/17 0913      PT SHORT TERM GOAL #1   Title STG=LTG           PT Long Term Goals - 02/15/17 1335      PT LONG TERM GOAL #1   Title Patient will be independent in self management through postural correction and safe self progression of HEP  08/06/15   Baseline able   Time 6   Period Weeks   Status Achieved     PT LONG TERM GOAL #2   Title Cervical extension, sidebending and rotation improved to grossly 50 degrees needed for ADLS and driving with greater ease.  Baseline See flowsheet, AROM improved in all ranges only tightness not pain   Time 6   Period Weeks   Status Achieved     PT LONG TERM GOAL #3   Title Deep cervical flexor and extensor strength as well as periscapular muscle strength grossly 4+/5 to 5-/5.   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  All other goals met.  She is happy with the results.    PT Next Visit Plan D/C to HEP   PT Home Exercise Plan Dry needling precautians and home handout, intial sitting and standing posture, neck stretches and pec stretches supine scapular stabilizers, scapular clock, physioball exercises   Consulted and Agree with Plan of Care Patient      Patient will benefit from skilled therapeutic intervention in order to improve the following deficits and impairments:  Pain, Impaired UE functional use, Postural dysfunction, Improper body mechanics, Decreased strength, Decreased range of motion, Increased muscle spasms  Visit Diagnosis: Chronic pain in left shoulder  Muscle spasm of back  Muscle weakness (generalized)  Abnormal posture  DDD (degenerative disc disease), cervical  Stiffness of cervical spine  Muscle weakness     Problem List Patient Active Problem List   Diagnosis Date Noted  . Hyperlipidemia LDL goal <130 11/28/2016  . Hyperglycemia 11/28/2016  . Allergic rhinitis due to pollen 10/14/2015  . Right thyroid nodule 05/17/2015  . DDD (degenerative disc disease), cervical 05/03/2015  . GAD (generalized anxiety disorder) 05/03/2015  . Meralgia paresthetica of left side 10/21/2014  . Carpal tunnel syndrome, right 09/01/2014  . Essential hypertension 07/24/2014  . Routine general medical examination at a health care facility 07/24/2014    [facility] PTA 02/15/2017, 1:38 PM  [facility] [address] [place], [place], [address] Phone: [phone]   Fax:  [phone]  Name: KASHENA NOVITSKI MRN: [id number] Date  of Birth: March 21, 1966   PHYSICAL THERAPY DISCHARGE SUMMARY  Visits from Start of Care: 8  Current functional level related to goals / functional outcomes: As above goals   Remaining deficits: As above, Pt pleased with current level of function  Education / Equipment: TDN and HEP  Plan: Patient agrees to discharge.  Patient goals were partially met. Patient is being discharged due to meeting the stated rehab goals.  ????? and being pleased with current functional level Voncille Lo, PT Certified Exercise Expert for the Aging Adult  02/22/17 7:39 AM Phone: [phone] Fax: [phone]

## 2017-02-20 ENCOUNTER — Ambulatory Visit: Payer: Managed Care, Other (non HMO) | Admitting: Physical Therapy

## 2017-02-22 ENCOUNTER — Encounter: Payer: Managed Care, Other (non HMO) | Admitting: Physical Therapy

## 2017-02-27 ENCOUNTER — Encounter: Payer: Managed Care, Other (non HMO) | Admitting: Physical Therapy

## 2017-03-01 ENCOUNTER — Ambulatory Visit: Payer: Managed Care, Other (non HMO) | Admitting: Physical Therapy

## 2017-04-10 ENCOUNTER — Other Ambulatory Visit (INDEPENDENT_AMBULATORY_CARE_PROVIDER_SITE_OTHER): Payer: Managed Care, Other (non HMO)

## 2017-04-10 ENCOUNTER — Encounter: Payer: Self-pay | Admitting: Internal Medicine

## 2017-04-10 ENCOUNTER — Ambulatory Visit (INDEPENDENT_AMBULATORY_CARE_PROVIDER_SITE_OTHER): Payer: Managed Care, Other (non HMO) | Admitting: Internal Medicine

## 2017-04-10 VITALS — BP 134/84 | HR 83 | Temp 98.8°F | Resp 16 | Ht 63.0 in | Wt 185.0 lb

## 2017-04-10 DIAGNOSIS — M5030 Other cervical disc degeneration, unspecified cervical region: Secondary | ICD-10-CM | POA: Diagnosis not present

## 2017-04-10 DIAGNOSIS — M2550 Pain in unspecified joint: Secondary | ICD-10-CM

## 2017-04-10 DIAGNOSIS — M7652 Patellar tendinitis, left knee: Secondary | ICD-10-CM

## 2017-04-10 DIAGNOSIS — Z23 Encounter for immunization: Secondary | ICD-10-CM

## 2017-04-10 DIAGNOSIS — M5412 Radiculopathy, cervical region: Secondary | ICD-10-CM | POA: Diagnosis not present

## 2017-04-10 LAB — COMPREHENSIVE METABOLIC PANEL
ALT: 14 U/L (ref 0–35)
AST: 16 U/L (ref 0–37)
Albumin: 4.3 g/dL (ref 3.5–5.2)
Alkaline Phosphatase: 81 U/L (ref 39–117)
BILIRUBIN TOTAL: 0.2 mg/dL (ref 0.2–1.2)
BUN: 12 mg/dL (ref 6–23)
CO2: 27 mEq/L (ref 19–32)
CREATININE: 0.77 mg/dL (ref 0.40–1.20)
Calcium: 9.5 mg/dL (ref 8.4–10.5)
Chloride: 103 mEq/L (ref 96–112)
GFR: 84.16 mL/min (ref >60.00)
GLUCOSE: 103 mg/dL — AB (ref 70–99)
Potassium: 3.6 mEq/L (ref 3.5–5.1)
SODIUM: 140 meq/L (ref 135–145)
TOTAL PROTEIN: 7.1 g/dL (ref 6.0–8.3)

## 2017-04-10 LAB — CBC WITH DIFFERENTIAL/PLATELET
BASOS ABS: 0.1 10*3/uL (ref 0.0–0.1)
Basophils Relative: 1 % (ref 0.0–3.0)
Eosinophils Absolute: 0.1 10*3/uL (ref 0.0–0.7)
Eosinophils Relative: 1.4 % (ref 0.0–5.0)
HCT: 41.5 % (ref 36.0–46.0)
Hemoglobin: 14 g/dL (ref 12.0–15.0)
LYMPHS ABS: 1.6 10*3/uL (ref 0.7–4.0)
Lymphocytes Relative: 18.7 % (ref 12.0–46.0)
MCHC: 33.8 g/dL (ref 30.0–36.0)
MCV: 86.6 fl (ref 78.0–100.0)
MONO ABS: 0.6 10*3/uL (ref 0.1–1.0)
Monocytes Relative: 7 % (ref 3.0–12.0)
NEUTROS PCT: 71.9 % (ref 43.0–77.0)
Neutro Abs: 6.2 10*3/uL (ref 1.4–7.7)
Platelets: 309 10*3/uL (ref 150.0–400.0)
RBC: 4.79 Mil/uL (ref 3.87–5.11)
RDW: 12.4 % (ref 11.5–15.5)
WBC: 8.6 10*3/uL (ref 4.0–10.5)

## 2017-04-10 MED ORDER — NAPROXEN 375 MG PO TABS: 375.0000 mg | ORAL_TABLET | Freq: Two times a day (BID) | ORAL | 3 refills | Status: DC

## 2017-04-10 NOTE — Progress Notes (Signed)
Subjective:  Patient ID: Hannah Miranda, female    DOB: 1966/05/31  Age: 51 y.o. MRN: [id number]  CC: Osteoarthritis and Knee Pain   HPI Hannah Miranda presents for arthralgias intermittently for 3-4 months. She complains of pain in both knees worse on the left than the right. The left knee pain has occasionally been swollen. The left knee pain is worsened when she bends her knee backwards. She has also had discomfort in both elbows. In addition to that, a few weeks ago she had redness and swelling on the dorsum of the third knuckle on her right hand. She's had no recent tick bites and denies rash, fever, fatigue, weight loss, lymphadenopathy, or night sweats.  Outpatient Medications Prior to Visit  Medication Sig Dispense Refill  . ALPRAZolam (XANAX) 0.5 MG tablet TAKE 1 TABLET BY MOUTH TWICE A DAY AS NEEDED 60 tablet 3  . cetirizine (ZYRTEC) 10 MG tablet Take 10 mg by mouth daily.    . mometasone (NASONEX) 50 MCG/ACT nasal spray Place 2 sprays into the nose as needed.    . montelukast (SINGULAIR) 10 MG tablet TAKE 1 TABLET BY MOUTH AS NEEDED FOR SPRING AND FALL 90 tablet 1  . omeprazole (PRILOSEC) 20 MG capsule TAKE 1 CAPSULE (20 MG TOTAL) BY MOUTH DAILY. 90 capsule 1  . naproxen (NAPROSYN) 375 MG tablet Take 1 tablet (375 mg total) by mouth 2 (two) times daily with a meal. 60 tablet 3  . valsartan (DIOVAN) 320 MG tablet Take 1 tablet (320 mg total) by mouth daily. (Patient not taking: Reported on 01/16/2017) 90 tablet 3   No facility-administered medications prior to visit.     ROS Review of Systems  Constitutional: Negative.  Negative for chills, diaphoresis, fatigue and fever.  HENT: Negative.  Negative for facial swelling, sinus pressure, sore throat and trouble swallowing.   Eyes: Negative.   Respiratory: Negative.  Negative for cough and shortness of breath.   Cardiovascular: Negative for chest pain, palpitations and leg swelling.  Gastrointestinal: Negative for abdominal pain,  constipation, diarrhea, nausea and vomiting.  Endocrine: Negative.   Genitourinary: Negative.  Negative for difficulty urinating.  Musculoskeletal: Positive for arthralgias and neck pain. Negative for back pain and myalgias.  Skin: Positive for color change. Negative for rash.  Allergic/Immunologic: Negative.   Neurological: Negative.  Negative for dizziness, weakness, numbness and headaches.  Hematological: Negative.  Negative for adenopathy. Does not bruise/bleed easily.  Psychiatric/Behavioral: Negative.     Objective:  BP 134/84 (BP Location: Left Arm, Patient Position: Sitting, Cuff Size: Normal)   Pulse 83   Temp 98.8 F (37.1 C) (Oral)   Resp 16   Ht 5\' 3"  (1.6 m)   Wt 185 lb (83.9 kg)   SpO2 99%   BMI 32.77 kg/m   BP Readings from Last 3 Encounters:  04/10/17 134/84  11/28/16 (!) 144/86  10/13/15 110/78    Wt Readings from Last 3 Encounters:  04/10/17 185 lb (83.9 kg)  11/28/16 182 lb 4 oz (82.7 kg)  10/13/15 174 lb (78.9 kg)    Physical Exam  Constitutional: She is oriented to person, place, and time. No distress.  HENT:  Mouth/Throat: Oropharynx is clear and moist. No oropharyngeal exudate.  Eyes: Conjunctivae are normal. Right eye exhibits no discharge. Left eye exhibits no discharge. No scleral icterus.  Neck: Normal range of motion. Neck supple. No JVD present. No thyromegaly present.  Cardiovascular: Normal rate, regular rhythm and intact distal pulses.  Exam reveals no  gallop and no friction rub.   No murmur heard. Pulmonary/Chest: Effort normal and breath sounds normal. No respiratory distress. She has no wheezes. She has no rales. She exhibits no tenderness.  Abdominal: Soft. Bowel sounds are normal. She exhibits no distension and no mass. There is no tenderness. There is no rebound and no guarding.  Musculoskeletal: Normal range of motion. She exhibits no edema or deformity.       Left knee: She exhibits swelling. She exhibits normal range of motion, no  effusion, no deformity, no erythema, normal alignment, normal patellar mobility and no bony tenderness. Tenderness found. Patellar tendon tenderness noted. No medial joint line and no lateral joint line tenderness noted.  There is mild swelling and crepitance in the left knee. There is also tenderness above and below the patella. There is no significant effusion. All of the other joints are nontender and nonswollen.  Lymphadenopathy:    She has no cervical adenopathy.  Neurological: She is alert and oriented to person, place, and time.  Skin: Skin is warm and dry. No rash noted. She is not diaphoretic. No erythema. No pallor.  Vitals reviewed.   Lab Results  Component Value Date   WBC 8.6 04/10/2017   HGB 14.0 04/10/2017   HCT 41.5 04/10/2017   PLT 309.0 04/10/2017   GLUCOSE 103 (H) 04/10/2017   CHOL 185 11/28/2016   TRIG 143.0 11/28/2016   HDL 35.30 (L) 11/28/2016   LDLCALC 121 (H) 11/28/2016   ALT 14 04/10/2017   AST 16 04/10/2017   NA 140 04/10/2017   K 3.6 04/10/2017   CL 103 04/10/2017   CREATININE 0.77 04/10/2017   BUN 12 04/10/2017   CO2 27 04/10/2017   TSH 1.42 11/28/2016   HGBA1C 5.6 11/28/2016    Mm Screening Breast Tomo Bilateral  Result Date: 11/29/2016 CLINICAL DATA:  Screening. EXAM: 2D DIGITAL SCREENING BILATERAL MAMMOGRAM WITH CAD AND ADJUNCT TOMO COMPARISON:  Previous exam(s). ACR Breast Density Category b: There are scattered areas of fibroglandular density. FINDINGS: There are no findings suspicious for malignancy. Images were processed with CAD. IMPRESSION: No mammographic evidence of malignancy. A result letter of this screening mammogram will be mailed directly to the patient. RECOMMENDATION: Screening mammogram in one year. (Code:[id number]-B-01Y) BI-RADS CATEGORY  1: Negative. Electronically Signed   By: Harmon Pier M.D.   On: 11/29/2016 16:47    Assessment & Plan:   Sarin was seen today for osteoarthritis and knee pain.  Diagnoses and all orders for this  visit:  Need for influenza vaccination -     Flu Vaccine QUAD 6+ mos PF IM (Fluarix Quad PF)  Patellar tendonitis of left knee- will start an NSAID for symptom relief. I also asked her to see sports medicine to see if there is a rehabilitation protocol that she can do to treat this. -     Ambulatory referral to Sports Medicine  Arthralgia, unspecified joint- screen for Lyme disease is negative. I was concerned about the red knuckle being a sign of dermatomyositis but her ANA is negative so this is very unlikely, her rheumatoid factor is negative. Will treat with an anti-inflammatory. -     Comprehensive metabolic panel; Future -     CBC with Differential/Platelet; Future -     Lyme Ab/Western Blot Reflex; Future -     Rheumatoid factor; Future -     Cyclic citrul peptide antibody, IgG; Future -     ANA; Future -     naproxen (NAPROSYN) 375  MG tablet; Take 1 tablet (375 mg total) by mouth 2 (two) times daily with a meal.  Radiculitis of left cervical region -     naproxen (NAPROSYN) 375 MG tablet; Take 1 tablet (375 mg total) by mouth 2 (two) times daily with a meal.  DDD (degenerative disc disease), cervical -     naproxen (NAPROSYN) 375 MG tablet; Take 1 tablet (375 mg total) by mouth 2 (two) times daily with a meal.   I am having Ms. Jaskulski maintain her cetirizine, mometasone, montelukast, omeprazole, ALPRAZolam, valsartan, and naproxen.  Meds ordered this encounter  Medications  . naproxen (NAPROSYN) 375 MG tablet    Sig: Take 1 tablet (375 mg total) by mouth 2 (two) times daily with a meal.    Dispense:  60 tablet    Refill:  3     Follow-up: Return in about 3 months (around 07/10/2017).  Sanda Linger, MD

## 2017-04-10 NOTE — Patient Instructions (Signed)
Patellar Tendinitis  Patellar tendinitis, also called jumper's knee, is inflammation of the patellar tendon. Tendons are cord-like tissues that connect muscles to bones. The patellar tendon connects the bottom of the kneecap (patella) to the top of the shin bone (tibia).  Patellar tendinitis causes pain in the front of the knee. The condition happens in the following stages:   Stage 1. In this stage, there is pain only after activity.   Stage 2: In this stage, you have pain during and after activity.   Stage 3: In this stage, you have pain during and after activity that limits your ability to do the activity.   Stage 4: In this stage, the tendon tears and severely limits your activity.    What are the causes?  This condition is caused by repeated (repetitive) stress on the tendon. This stress may cause the tendon to stretch, swell, thicken, or tear.  What increases the risk?  The following factors make you more likely to develop this condition:   Participating in sports that involve running, kicking and jumping, especially on hard surfaces. These include:  ? Basketball.  ? Volleyball.  ? Soccer.  ? Track and field.   Having tight thigh muscles.   Having received steroid injections in the tendon.   Having had knee surgery.   Being 16-40 years old.   Having rheumatoid arthritis or diabetes.   Training too hard.    What are the signs or symptoms?  The main symptom of this condition is pain in the front of the knee. The pain usually starts slowly then it gradually gets worse. It may become painful to straighten your leg.  How is this diagnosed?  This condition may be diagnosed based on:   Your symptoms.   A medical history.   A physical exam. During the physical exam, your health care provider may check for tenderness in your patella, tightness in your thigh muscles, and pain when you straighten your knee.   Imaging tests, including:  ? X-rays. These will show the position and condition of your  patella.  ? MRI. This will show any tears in your tendon.  ? Ultrasound. This will show any swelling in your tendon and the thickness of your tendon.    How is this treated?  Treatment for this condition depends on the stage of the condition. It may involve:   Avoiding activities that cause pain.   Icing your knee.   Taking an NSAID to reduce pain and swelling.   Doing stretching and strengthening exercises (physical therapy) when pain and swelling improve.   Having sound wave stimulation to promote healing.   Wearing a knee brace. This may be needed if your condition does not improve with treatment.   Using crutches or a walker. This may be needed if your condition does not improve with treatment.   Surgery. This may be done if you have stage 4 tendinitis.    Follow these instructions at home:  If You Have a Knee Brace:   Wear it as told by your health care provider. Remove it only as told by your health care provider.   Loosen the brace if your toes become numb and tingle, or if they turn cold and blue.   Do not let your brace get wet if it is not waterproof.   Keep the brace clean.   Ask your health care provider when it is safe for you to drive.  Managing pain, stiffness, and swelling     Take over-the-counter and prescription medicines only as told by your health care provider.   If directed, apply ice to the injured area.  ? Put ice in a plastic bag.  ? Place a towel between your skin and the bag.  ? Leave the ice on for 20 minutes, 2-3 times a day.   Move your toes often to avoid stiffness and to lessen swelling.   Raise (elevate) your knee above the level of your heart while you are sitting or lying down.  Activity   Return to your normal activities as told by your health care provider. Ask your health care provider what activities are safe for you.   Do exercises as told by your health care provider.  General instructions   Do not use the injured limb to support your body weight until your  health care provider says that you can. Use your crutches or walker as told by your health care provider.   Keep all follow-up visits as told by your health care provider. This is important.  How is this prevented?   Warm up and stretch before being active.   Cool down and stretch after being active.   Give your body time to rest between periods of activity.   Make sure to use equipment that fits you.   Be safe and responsible while being active to avoid falls.   Do at least 150 minutes of moderate-intensity exercise each week, such as brisk walking or water aerobics.   Maintain physical fitness, including:  ? Strength.  ? Flexibility.  ? Cardiovascular fitness.  ? Endurance.  Contact a health care provider if:   Your symptoms have not improve in 6 weeks.   Your symptoms get worse.  This information is not intended to replace advice given to you by your health care provider. Make sure you discuss any questions you have with your health care provider.  Document Released: 07/24/2005 Document Revised: 03/28/2016 Document Reviewed: 04/27/2015  Elsevier Interactive Patient Education  2018 [employer]

## 2017-04-11 ENCOUNTER — Encounter: Payer: Self-pay | Admitting: Internal Medicine

## 2017-04-11 LAB — CYCLIC CITRUL PEPTIDE ANTIBODY, IGG: Cyclic Citrullin Peptide Ab: <16 Units

## 2017-04-11 LAB — RHEUMATOID FACTOR: Rhuematoid fact SerPl-aCnc: <14 IU/mL (ref <14)

## 2017-04-11 LAB — LYME AB/WESTERN BLOT REFLEX: B burgdorferi Ab IgG+IgM: <0.9 Index (ref <0.90)

## 2017-04-11 LAB — ANA: ANA: NEGATIVE

## 2017-04-29 NOTE — Progress Notes (Signed)
[facility] [address] [place], [place] [address] Phone: [phone] Subjective:    I'm seeing this patient by the request  of:   Etta Grandchild, MD   CC: Left knee pain  [id number]  Hannah Miranda is a 51 y.o. female coming in with complaint of knee pain. Her left knee hurts the worse. Says it bothered her about a month ago. Says it is stiff and feels like it has fluid. She has sharp pain around the knee cap and she gets some swelling in the back of the knee. She has trouble bending it. Her right knee bothers her but not like the left one.   Onset- gradual over the last multiple weeks. Location- Top of the knee gets stiff Duration- can take days to resolve Character-dull throbbing aching can sometimes have difficulty bending Aggravating factors- Getting in and out of bed, standing up, stairs  Reliving factors- Staying off of it Therapies tried- Aleve Severity- Now 2 at its worse 6 out of 10     Past Medical History:  Diagnosis Date  . Anxiety   . Depression   . GERD (gastroesophageal reflux disease)   . Hypertension    No past surgical history on file. Social History   Social History  . Marital status: Married    Spouse name: N/A  . Number of children: N/A  . Years of education: N/A   Social History Main Topics  . Smoking status: Never Smoker  . Smokeless tobacco: Never Used  . Alcohol use 3.6 oz/week    3 Glasses of wine, 3 Cans of beer per week  . Drug use: No  . Sexual activity: Yes    Birth control/ protection: Post-menopausal   Other Topics Concern  . Not on file   Social History Narrative  . No narrative on file   Allergies  Allergen Reactions  . Latex    Family History  Problem Relation Age of Onset  . Alcohol abuse Mother   . Early death Mother   . Hypertension Father   . Diabetes Father   . COPD Father   . Breast cancer Maternal Aunt      Past medical history, social, surgical and family history all  reviewed in electronic medical record.  No pertanent information unless stated regarding to the chief complaint.   Review of Systems:Review of systems updated and as accurate as of 04/29/17  No headache, visual changes, nausea, vomiting, diarrhea, constipation, dizziness, abdominal pain, skin rash, fevers, chills, night sweats, weight loss, swollen lymph nodes, body aches, joint swelling, muscle aches, chest pain, shortness of breath, mood changes.   Objective  There were no vitals taken for this visit. Systems examined below as of 04/29/17   General: No apparent distress alert and oriented x3 mood and affect normal, dressed appropriately.  HEENT: Pupils equal, extraocular movements intact  Respiratory: Patient's speak in full sentences and does not appear short of breath  Cardiovascular: No lower extremity edema, non tender, no erythema  Skin: Warm dry intact with no signs of infection or rash on extremities or on axial skeleton.  Abdomen: Soft nontender  Neuro: Cranial nerves II through XII are intact, neurovascularly intact in all extremities with 2+ DTRs and 2+ pulses.  Lymph: No lymphadenopathy of posterior or anterior cervical chain or axillae bilaterally.  Gait normal with good balance and coordination.  MSK:  Non tender with full range of motion and good stability and symmetric strength and tone  of shoulders, elbows, wrist, hip and ankles bilaterally.  Knee:Left Lateral tilt of the patella noted Palpation tenderness to palpation over the lateral aspect ROM full in flexion and extension and lower leg rotation. Ligaments with solid consistent endpoints including ACL, PCL, LCL, MCL. Negative Mcmurray's, Apley's, and Thessalonian tests. Moderate painful patellar compression. Patellar glide with mild crepitus. Patellar and quadriceps tendons unremarkable. Hamstring and quadriceps strength is normal.    MSK US performed of: Left knee This study was ordered, performed, and  interpreted by Terrilee Files D.O.  Knee: All structures visualized. Narrowing of the patellofemoral joint. Minimal arthritic changes appear. No significant effusion noted. Meniscus are intact.  IMPRESSION:  Patellofemoral syndrome  Procedure  97110; 15 additional minutes spent for Therapeutic exercises as stated in above notes.  This included exercises focusing on stretching, strengthening, with significant focus on eccentric aspects.   Long term goals include an improvement in range of motion, strength, endurance as well as avoiding reinjury. Patient's frequency would include in 1-2 times a day, 3-5 times a week for a duration of 6-12 weeks. Patellofemoral Syndrome  Reviewed anatomy using anatomical model and how PFS occurs.  Given rehab exercises handout for VMO, hip abductors, core, entire kinetic chain including proprioception exercises including cone touches, step downs, hip elevations and turn outs.  Could benefit from PT, regular exercise, upright biking, and a PFS knee brace to assist with tracking abnormalities.  Proper technique shown and discussed handout in great detail with ATC.  All questions were discussed and answered.  \    Impression and Recommendations:     This case required medical decision making of moderate complexity.      Note: This dictation was prepared with Dragon dictation along with smaller phrase technology. Any transcriptional errors that result from this process are unintentional.

## 2017-04-30 ENCOUNTER — Encounter: Payer: Self-pay | Admitting: Family Medicine

## 2017-04-30 ENCOUNTER — Ambulatory Visit (INDEPENDENT_AMBULATORY_CARE_PROVIDER_SITE_OTHER): Payer: Managed Care, Other (non HMO) | Admitting: Family Medicine

## 2017-04-30 ENCOUNTER — Ambulatory Visit: Payer: Self-pay

## 2017-04-30 VITALS — BP 120/80 | HR 90 | Ht 63.0 in | Wt 184.0 lb

## 2017-04-30 DIAGNOSIS — M25562 Pain in left knee: Secondary | ICD-10-CM | POA: Diagnosis not present

## 2017-04-30 DIAGNOSIS — S83012A Lateral subluxation of left patella, initial encounter: Secondary | ICD-10-CM | POA: Insufficient documentation

## 2017-04-30 MED ORDER — VITAMIN D (ERGOCALCIFEROL) 1.25 MG (50000 UNIT) PO CAPS: 50000.0000 [IU] | ORAL_CAPSULE | ORAL | 0 refills | Status: DC

## 2017-04-30 MED ORDER — DICLOFENAC SODIUM 2 % TD SOLN: 2.0000 "application " | Freq: Two times a day (BID) | TRANSDERMAL | 3 refills | Status: DC

## 2017-04-30 NOTE — Assessment & Plan Note (Signed)
Patient does have more of a patellar subluxation. Discussed with patient at great length. We discussed icing regimen and home exercises. We discussed objective is to do a which ones to avoid. Patient will increase activity as tolerated. We discussed avoiding certain activities. Patient has worsening symptoms could be a candidate for injections in formal physical therapy. Patient try this first including topical anti-inflammatory's.

## 2017-04-30 NOTE — Patient Instructions (Signed)
Good to see you.  Ice 20 minutes 2 times daily. Usually after activity and before bed. Exercises 3 times a week.  pennsaid pinkie amount topically 2 times daily as needed.   Once weekly vitamin D for 12 weeks.  Spenco orthotics "total support" online would be great  Over the counter also get  Turmeric  twice daily  Tart cherry extract any dose at night Biking is better then walking when you can  See me again in 4 weeks and if not better we will consider labs and physical therapy and possibly injection

## 2017-05-01 ENCOUNTER — Other Ambulatory Visit: Payer: Self-pay | Admitting: Internal Medicine

## 2017-05-01 DIAGNOSIS — J301 Allergic rhinitis due to pollen: Secondary | ICD-10-CM

## 2017-07-02 ENCOUNTER — Other Ambulatory Visit: Payer: Self-pay | Admitting: Internal Medicine

## 2017-07-02 DIAGNOSIS — F411 Generalized anxiety disorder: Secondary | ICD-10-CM

## 2017-09-26 ENCOUNTER — Telehealth: Payer: Self-pay | Admitting: *Deleted

## 2017-09-26 ENCOUNTER — Other Ambulatory Visit: Payer: Self-pay | Admitting: Internal Medicine

## 2017-09-26 DIAGNOSIS — M5030 Other cervical disc degeneration, unspecified cervical region: Secondary | ICD-10-CM

## 2017-09-26 DIAGNOSIS — J301 Allergic rhinitis due to pollen: Secondary | ICD-10-CM

## 2017-09-26 DIAGNOSIS — M2550 Pain in unspecified joint: Secondary | ICD-10-CM

## 2017-09-26 DIAGNOSIS — M5412 Radiculopathy, cervical region: Secondary | ICD-10-CM

## 2017-09-26 DIAGNOSIS — K219 Gastro-esophageal reflux disease without esophagitis: Secondary | ICD-10-CM

## 2017-09-26 MED ORDER — MONTELUKAST SODIUM 10 MG PO TABS: ORAL_TABLET | ORAL | 1 refills | Status: DC

## 2017-09-26 MED ORDER — OMEPRAZOLE 20 MG PO CPDR
20.0000 mg | DELAYED_RELEASE_CAPSULE | Freq: Every day | ORAL | 1 refills | Status: DC
Start: 2017-09-26 — End: 2018-10-23

## 2017-09-26 MED ORDER — NAPROXEN 375 MG PO TABS: 375.0000 mg | ORAL_TABLET | Freq: Two times a day (BID) | ORAL | 3 refills | Status: DC

## 2017-09-26 NOTE — Telephone Encounter (Addendum)
Rf req for Omeprazole 20 mg 1 qd # 90. Last filled 05/01/17 # 90 w/ 1 RF. Rf req Montelukast 10 mg 1 qd prn. Last filled 05/01/17 #90 w/ 1 RF.   Last OV 04/10/17. [facility] [address]

## 2017-11-07 ENCOUNTER — Encounter: Payer: Self-pay | Admitting: Internal Medicine

## 2017-11-07 ENCOUNTER — Ambulatory Visit (INDEPENDENT_AMBULATORY_CARE_PROVIDER_SITE_OTHER)
Admission: RE | Admit: 2017-11-07 | Discharge: 2017-11-07 | Disposition: A | Discharge status: Home / Self Care | Payer: BLUE CROSS/BLUE SHIELD | Source: Ambulatory Visit | Attending: Internal Medicine | Admitting: Internal Medicine

## 2017-11-07 ENCOUNTER — Ambulatory Visit: Payer: BLUE CROSS/BLUE SHIELD | Admitting: Internal Medicine

## 2017-11-07 VITALS — BP 132/80 | HR 83 | Temp 99.0°F | Ht 63.0 in | Wt 178.0 lb

## 2017-11-07 DIAGNOSIS — R221 Localized swelling, mass and lump, neck: Secondary | ICD-10-CM | POA: Diagnosis not present

## 2017-11-07 DIAGNOSIS — M79652 Pain in left thigh: Secondary | ICD-10-CM | POA: Diagnosis not present

## 2017-11-07 DIAGNOSIS — G5712 Meralgia paresthetica, left lower limb: Secondary | ICD-10-CM

## 2017-11-07 NOTE — Patient Instructions (Signed)

## 2017-11-07 NOTE — Progress Notes (Signed)
Subjective:  Patient ID: Hannah Miranda, female    DOB: 1965/08/26  Age: 52 y.o. MRN: [id number]  CC: Hypertension   HPI ASUZENA WEIS presents for f/up - She complains of a one-month history of pain over her left lateral thigh that goes away when she sits still but increases when she stands for long periods of time.  She also complains of a feeling of numbness and warmth on the lateral aspect of the thigh.  She had the same thing several years ago and was diagnosed and treated as meralgia paresthetica.  She did physical therapy and got better.  She has been taking naproxen for symptom relief.  She also complains of a mass on the right side of her neck.  She had a CT scan done about a year ago that showed it was a benign cystic lesion.  She feels like the area is getting bigger and is starting to feel tense.  She wants to know if it should be removed.  Outpatient Medications Prior to Visit  Medication Sig Dispense Refill  . ALPRAZolam (XANAX) 0.5 MG tablet TAKE 1 TABLET TWICE A DAY AS NEEDED 60 tablet 2  . cetirizine (ZYRTEC) 10 MG tablet Take 10 mg by mouth daily.    . Diclofenac Sodium (PENNSAID) 2 % SOLN Place 2 application onto the skin 2 (two) times daily. 112 g 3  . mometasone (NASONEX) 50 MCG/ACT nasal spray Place 2 sprays into the nose as needed.    . montelukast (SINGULAIR) 10 MG tablet TAKE 1 TABLET BY MOUTH AS NEEDED FOR SPRING AND FALL 90 tablet 1  . naproxen (NAPROSYN) 375 MG tablet Take 1 tablet (375 mg total) by mouth 2 (two) times daily with a meal. 60 tablet 3  . omeprazole (PRILOSEC) 20 MG capsule Take 1 capsule (20 mg total) by mouth daily. 90 capsule 1  . valsartan (DIOVAN) 320 MG tablet Take 1 tablet (320 mg total) by mouth daily. 90 tablet 3  . Vitamin D, Ergocalciferol, (DRISDOL) 50000 units CAPS capsule Take 1 capsule (50,000 Units total) by mouth every 7 (seven) days. 12 capsule 0   No facility-administered medications prior to visit.     ROS Review of Systems    Constitutional: Negative.  Negative for diaphoresis, fatigue and unexpected weight change.  HENT: Negative.  Negative for sore throat and trouble swallowing.   Respiratory: Negative.  Negative for apnea, cough and shortness of breath.   Cardiovascular: Negative.  Negative for chest pain, palpitations and leg swelling.  Gastrointestinal: Negative for abdominal pain, constipation, diarrhea, nausea and vomiting.  Endocrine: Negative.   Genitourinary: Negative.  Negative for difficulty urinating.  Musculoskeletal: Negative.  Negative for arthralgias and myalgias.  Skin: Negative.  Negative for color change, pallor and rash.  Neurological: Negative.   Hematological: Negative for adenopathy. Does not bruise/bleed easily.  Psychiatric/Behavioral: Negative.     Objective:  BP 132/80 (BP Location: Left Arm, Patient Position: Sitting, Cuff Size: Large)   Pulse 83   Temp 99 F (37.2 C) (Oral)   Ht 5\' 3"  (1.6 m)   Wt 178 lb (80.7 kg)   SpO2 99%   BMI 31.53 kg/m   BP Readings from Last 3 Encounters:  11/07/17 132/80  04/30/17 120/80  04/10/17 134/84    Wt Readings from Last 3 Encounters:  11/07/17 178 lb (80.7 kg)  04/30/17 184 lb (83.5 kg)  04/10/17 185 lb (83.9 kg)    Physical Exam  Constitutional: She is oriented to person,  place, and time. No distress.  HENT:  Mouth/Throat: Oropharynx is clear and moist. No oropharyngeal exudate.  Eyes: Conjunctivae are normal. Left eye exhibits no discharge. No scleral icterus.  Neck: Normal range of motion. Neck supple. No JVD present. No thyroid mass and no thyromegaly present.    Cardiovascular: Normal rate, regular rhythm and normal heart sounds. Exam reveals no gallop and no friction rub.  No murmur heard. Pulmonary/Chest: Effort normal and breath sounds normal. No respiratory distress. She has no wheezes. She has no rales.  Abdominal: Soft. Bowel sounds are normal. She exhibits no distension and no mass. There is no tenderness. There  is no guarding.  Musculoskeletal: Normal range of motion. She exhibits no edema, tenderness or deformity.       Left hip: Normal. She exhibits normal range of motion, normal strength, no tenderness, no bony tenderness and no swelling.       Lumbar back: Normal.       Left upper leg: Normal. She exhibits no tenderness, no bony tenderness, no swelling, no edema and no deformity.  Lymphadenopathy:    She has no cervical adenopathy.  Neurological: She is alert and oriented to person, place, and time. She has normal reflexes. She displays normal reflexes. No cranial nerve deficit. She exhibits normal muscle tone. Coordination normal.  Skin: Skin is warm and dry. No rash noted. She is not diaphoretic. No erythema. No pallor.  Vitals reviewed.   Lab Results  Component Value Date   WBC 8.6 04/10/2017   HGB 14.0 04/10/2017   HCT 41.5 04/10/2017   PLT 309.0 04/10/2017   GLUCOSE 103 (H) 04/10/2017   CHOL 185 11/28/2016   TRIG 143.0 11/28/2016   HDL 35.30 (L) 11/28/2016   LDLCALC 121 (H) 11/28/2016   ALT 14 04/10/2017   AST 16 04/10/2017   NA 140 04/10/2017   K 3.6 04/10/2017   CL 103 04/10/2017   CREATININE 0.77 04/10/2017   BUN 12 04/10/2017   CO2 27 04/10/2017   TSH 1.42 11/28/2016   HGBA1C 5.6 11/28/2016    Mm Screening Breast Tomo Bilateral  Result Date: 11/29/2016 CLINICAL DATA:  Screening. EXAM: 2D DIGITAL SCREENING BILATERAL MAMMOGRAM WITH CAD AND ADJUNCT TOMO COMPARISON:  Previous exam(s). ACR Breast Density Category b: There are scattered areas of fibroglandular density. FINDINGS: There are no findings suspicious for malignancy. Images were processed with CAD. IMPRESSION: No mammographic evidence of malignancy. A result letter of this screening mammogram will be mailed directly to the patient. RECOMMENDATION: Screening mammogram in one year. (Code:SM-B-01Y) BI-RADS CATEGORY  1: Negative. Electronically Signed   By: Harmon PierJeffrey  Hu M.D.   On: 11/29/2016 16:47    Dg Femur Min 2 Views  Left  Result Date: 11/07/2017 CLINICAL DATA:  Pain and numbness EXAM: LEFT FEMUR 2 VIEWS COMPARISON:  None. FINDINGS: Frontal and lateral views were obtained. No fracture or dislocation. No appreciable joint space narrowing or erosion. No knee joint effusion. Minimal calcification along the lateral left acetabulum probably represents calcific tendinosis. IMPRESSION: No fracture or dislocation. No appreciable arthropathy. Question mild tendinosis along lateral acetabulum. Electronically Signed   By: Bretta BangWilliam  Woodruff III M.D.   On: 11/07/2017 11:41   Assessment & Plan:   [name] was seen today for hypertension.  Diagnoses and all orders for this visit:  Pain of left thigh- Exam of the area is unremarkable.  The plain film is positive only for tendinitis.  Will treat more for meralgia paresthetica with PT. -     DG  FEMUR MIN 2 VIEWS LEFT; Future  Meralgia paresthetica, left- as above -     Ambulatory referral to Physical Therapy  Mass of right side of neck- I reassured her that the previous CT scan showed that this is a benign cystic lesion.  I do not think ENT will want to remove this but I have referred her for a second opinion. -     Ambulatory referral to ENT   I have discontinued Mckensi C. Wien "Chris"'s valsartan and Vitamin D (Ergocalciferol). I am also having her maintain her cetirizine, mometasone, Diclofenac Sodium, ALPRAZolam, naproxen, omeprazole, and montelukast.  No orders of the defined types were placed in this encounter.    Follow-up: Return if symptoms worsen or fail to improve.  Sanda Linger, MD

## 2017-11-08 DIAGNOSIS — L821 Other seborrheic keratosis: Secondary | ICD-10-CM | POA: Diagnosis not present

## 2017-11-08 DIAGNOSIS — D225 Melanocytic nevi of trunk: Secondary | ICD-10-CM | POA: Diagnosis not present

## 2017-11-08 DIAGNOSIS — D1801 Hemangioma of skin and subcutaneous tissue: Secondary | ICD-10-CM | POA: Diagnosis not present

## 2017-11-12 DIAGNOSIS — Z7289 Other problems related to lifestyle: Secondary | ICD-10-CM | POA: Diagnosis not present

## 2017-11-12 DIAGNOSIS — R221 Localized swelling, mass and lump, neck: Secondary | ICD-10-CM | POA: Diagnosis not present

## 2017-11-14 ENCOUNTER — Other Ambulatory Visit: Payer: Self-pay | Admitting: Otolaryngology

## 2017-11-14 DIAGNOSIS — R221 Localized swelling, mass and lump, neck: Secondary | ICD-10-CM

## 2017-11-24 ENCOUNTER — Ambulatory Visit
Admission: RE | Admit: 2017-11-24 | Discharge: 2017-11-24 | Disposition: A | Discharge status: Home / Self Care | Payer: BLUE CROSS/BLUE SHIELD | Source: Ambulatory Visit | Attending: Otolaryngology | Admitting: Otolaryngology

## 2017-11-24 DIAGNOSIS — R221 Localized swelling, mass and lump, neck: Secondary | ICD-10-CM | POA: Diagnosis not present

## 2017-11-24 MED ORDER — IOPAMIDOL (ISOVUE-300) INJECTION 61%
75.0000 mL | Freq: Once | INTRAVENOUS | Status: AC | PRN
  Administered 2017-11-24: 75 mL via INTRAVENOUS

## 2017-11-27 DIAGNOSIS — Q180 Sinus, fistula and cyst of branchial cleft: Secondary | ICD-10-CM

## 2017-12-04 NOTE — Pre-Procedure Instructions (Signed)
Hannah Miranda  12/04/2017    Your procedure is scheduled on Wednesday, Dec 12, 2017 at 10:30 AM.   Report to [facility] Entrance "A" Admitting Office at 8:30 AM.   Call this number if you have problems the morning of surgery: [phone]   Questions prior to day of surgery, please call [phone] between 8 & 4 PM.   Remember:  Do not eat food or drink liquids after midnight Tuesday, 12/11/17.  Take these medicines the morning of surgery with A SIP OF WATER: Cetirizine (Zyrtec), Alprazolam (Xanax) - if needed, Singulair - if needed, Omeprazole (Prilosec) - if needed  Stop Herbal medications and NSAIDS (Naprosyn, Aleve, Ibuprofen, etc) as of today. Do not use Aspirin products prior to surgery.   Do not wear jewelry, make-up or nail polish.  Do not wear lotions, powders, perfumes or deodorant.  Do not shave 48 hours prior to surgery.    Do not bring valuables to the hospital.  [facility] is not responsible for any belongings or valuables.  Contacts, dentures or bridgework may not be worn into surgery.  Leave your suitcase in the car.  After surgery it may be brought to your room.  For patients admitted to the hospital, discharge time will be determined by your treatment team.  [facility] - Preparing for Surgery  Before surgery, you can play an important role.  Because skin is not sterile, your skin needs to be as free of germs as possible.  You can reduce the number of germs on you skin by washing with CHG (chlorahexidine gluconate) soap before surgery.  CHG is an antiseptic cleaner which kills germs and bonds with the skin to continue killing germs even after washing.  Please DO NOT use if you have an allergy to CHG or antibacterial soaps.  If your skin becomes reddened/irritated stop using the CHG and inform your nurse when you arrive at Short Stay.  Do not shave (including legs and underarms) for at least 48 hours prior to the first CHG shower.  You may shave your  face.  Please follow these instructions carefully:   1.  Shower with CHG Soap the night before surgery and the                    morning of Surgery.  2.  If you choose to wash your hair, wash your hair first as usual with your       normal shampoo.  3.  After you shampoo, rinse your hair and body thoroughly to remove the  shampoo.  4.  Use CHG as you would any other liquid soap.  You can apply chg directly       to the skin and wash gently with scrungie or a clean washcloth.  5.  Apply the CHG Soap to your body ONLY FROM THE NECK DOWN.        Do not use on open wounds or open sores.  Avoid contact with your eyes, ears, mouth and genitals (private parts).  Wash genitals (private parts) with your normal soap.  6.  Wash thoroughly, paying special attention to the area where your surgery        will be performed.  7.  Thoroughly rinse your body with warm water from the neck down.  8.  DO NOT shower/wash with your normal soap after using and rinsing off       the CHG Soap.  9.  Pat yourself dry with  a clean towel.            10.  Wear clean pajamas.            11.  Place clean sheets on your bed the night of your first shower and do not        sleep with pets.  Day of Surgery  Shower as above. Do not apply any lotions/deodorants the morning of surgery.  Please wear clean clothes to the hospital.   Please read over the fact sheets that you were given.

## 2017-12-05 ENCOUNTER — Other Ambulatory Visit: Payer: Self-pay

## 2017-12-05 ENCOUNTER — Encounter (HOSPITAL_COMMUNITY)
Admission: RE | Admit: 2017-12-05 | Discharge: 2017-12-05 | Disposition: A | Discharge status: Home / Self Care | Payer: BLUE CROSS/BLUE SHIELD | Source: Ambulatory Visit | Attending: Otolaryngology | Admitting: Otolaryngology

## 2017-12-05 ENCOUNTER — Encounter (HOSPITAL_COMMUNITY): Payer: Self-pay

## 2017-12-05 ENCOUNTER — Other Ambulatory Visit (HOSPITAL_COMMUNITY): Payer: Self-pay | Admitting: *Deleted

## 2017-12-05 DIAGNOSIS — Z01818 Encounter for other preprocedural examination: Secondary | ICD-10-CM | POA: Diagnosis not present

## 2017-12-05 DIAGNOSIS — Z01812 Encounter for preprocedural laboratory examination: Secondary | ICD-10-CM | POA: Insufficient documentation

## 2017-12-05 DIAGNOSIS — I10 Essential (primary) hypertension: Secondary | ICD-10-CM | POA: Diagnosis not present

## 2017-12-05 HISTORY — DX: Headache, unspecified: R51.9

## 2017-12-05 HISTORY — DX: Unspecified osteoarthritis, unspecified site: M19.90

## 2017-12-05 HISTORY — DX: Neuralgia and neuritis, unspecified: M79.2

## 2017-12-05 HISTORY — DX: Personal history of other diseases of the digestive system: Z87.19

## 2017-12-05 HISTORY — DX: Pneumonia, unspecified organism: J18.9

## 2017-12-05 HISTORY — DX: Headache: R51

## 2017-12-05 LAB — CBC
HCT: 39.6 % (ref 36.0–46.0)
Hemoglobin: 13.3 g/dL (ref 12.0–15.0)
MCH: 28.7 pg (ref 26.0–34.0)
MCHC: 33.6 g/dL (ref 30.0–36.0)
MCV: 85.3 fL (ref 78.0–100.0)
Platelets: 219 10*3/uL (ref 150–400)
RBC: 4.64 MIL/uL (ref 3.87–5.11)
RDW: 12.2 % (ref 11.5–15.5)
WBC: 6 10*3/uL (ref 4.0–10.5)

## 2017-12-05 LAB — BASIC METABOLIC PANEL
Anion gap: 10 (ref 5–15)
BUN: 11 mg/dL (ref 6–20)
CALCIUM: 9.3 mg/dL (ref 8.9–10.3)
CHLORIDE: 107 mmol/L (ref 101–111)
CO2: 24 mmol/L (ref 22–32)
Creatinine, Ser: 0.66 mg/dL (ref 0.44–1.00)
GFR calc Af Amer: >60 mL/min (ref >60)
GFR calc non Af Amer: >60 mL/min (ref >60)
GLUCOSE: 105 mg/dL — AB (ref 65–99)
Potassium: 4 mmol/L (ref 3.5–5.1)
Sodium: 141 mmol/L (ref 135–145)

## 2017-12-05 NOTE — Progress Notes (Signed)
Pt denies cardiac history, chest pain or sob. Pt states she is not diabetic. 

## 2017-12-12 ENCOUNTER — Inpatient Hospital Stay (HOSPITAL_COMMUNITY): Payer: BLUE CROSS/BLUE SHIELD | Admitting: Anesthesiology

## 2017-12-12 ENCOUNTER — Encounter (HOSPITAL_COMMUNITY)
Admission: AD | Disposition: A | Discharge status: Home / Self Care | Payer: Self-pay | Source: Ambulatory Visit | Attending: Otolaryngology

## 2017-12-12 ENCOUNTER — Encounter (HOSPITAL_COMMUNITY): Payer: Self-pay | Admitting: *Deleted

## 2017-12-12 ENCOUNTER — Ambulatory Visit (HOSPITAL_COMMUNITY)
Admission: AD | Admit: 2017-12-12 | Discharge: 2017-12-12 | Disposition: A | Discharge status: Home / Self Care | Payer: BLUE CROSS/BLUE SHIELD | Source: Ambulatory Visit | Attending: Otolaryngology | Admitting: Otolaryngology

## 2017-12-12 DIAGNOSIS — Z7951 Long term (current) use of inhaled steroids: Secondary | ICD-10-CM | POA: Diagnosis not present

## 2017-12-12 DIAGNOSIS — Q180 Sinus, fistula and cyst of branchial cleft: Secondary | ICD-10-CM

## 2017-12-12 DIAGNOSIS — Z79899 Other long term (current) drug therapy: Secondary | ICD-10-CM | POA: Diagnosis not present

## 2017-12-12 DIAGNOSIS — F419 Anxiety disorder, unspecified: Secondary | ICD-10-CM | POA: Insufficient documentation

## 2017-12-12 DIAGNOSIS — Z791 Long term (current) use of non-steroidal anti-inflammatories (NSAID): Secondary | ICD-10-CM | POA: Insufficient documentation

## 2017-12-12 DIAGNOSIS — I10 Essential (primary) hypertension: Secondary | ICD-10-CM | POA: Insufficient documentation

## 2017-12-12 DIAGNOSIS — K449 Diaphragmatic hernia without obstruction or gangrene: Secondary | ICD-10-CM | POA: Diagnosis not present

## 2017-12-12 DIAGNOSIS — K219 Gastro-esophageal reflux disease without esophagitis: Secondary | ICD-10-CM | POA: Insufficient documentation

## 2017-12-12 DIAGNOSIS — F329 Major depressive disorder, single episode, unspecified: Secondary | ICD-10-CM | POA: Insufficient documentation

## 2017-12-12 DIAGNOSIS — R221 Localized swelling, mass and lump, neck: Secondary | ICD-10-CM | POA: Diagnosis not present

## 2017-12-12 HISTORY — PX: EAR CYST EXCISION: SHX22

## 2017-12-12 SURGERY — EXCISION, BRANCHIAL CLEFT CYST
Anesthesia: General | Site: Neck | Laterality: Right

## 2017-12-12 MED ORDER — HYDROCODONE-ACETAMINOPHEN 5-325 MG PO TABS: 1.0000 | ORAL_TABLET | ORAL | 0 refills | Status: AC | PRN

## 2017-12-12 MED ORDER — BACITRACIN ZINC 500 UNIT/GM EX OINT
TOPICAL_OINTMENT | CUTANEOUS | Status: DC | PRN
  Administered 2017-12-12: 1 via TOPICAL

## 2017-12-12 MED ORDER — MIDAZOLAM HCL 2 MG/2ML IJ SOLN
INTRAMUSCULAR | Status: AC
  Filled 2017-12-12: qty 2

## 2017-12-12 MED ORDER — ONDANSETRON HCL 4 MG/2ML IJ SOLN
INTRAMUSCULAR | Status: DC | PRN
  Administered 2017-12-12: 4 mg via INTRAVENOUS

## 2017-12-12 MED ORDER — BACITRACIN ZINC 500 UNIT/GM EX OINT
TOPICAL_OINTMENT | CUTANEOUS | Status: AC
  Filled 2017-12-12: qty 28.35

## 2017-12-12 MED ORDER — DEXAMETHASONE SODIUM PHOSPHATE 10 MG/ML IJ SOLN
INTRAMUSCULAR | Status: DC | PRN
  Administered 2017-12-12: 10 mg via INTRAVENOUS

## 2017-12-12 MED ORDER — FENTANYL CITRATE (PF) 250 MCG/5ML IJ SOLN
INTRAMUSCULAR | Status: AC
Start: 2017-12-12 — End: ?
  Filled 2017-12-12: qty 5

## 2017-12-12 MED ORDER — DIPHENHYDRAMINE HCL 50 MG/ML IJ SOLN
INTRAMUSCULAR | Status: AC
  Filled 2017-12-12: qty 1

## 2017-12-12 MED ORDER — LIDOCAINE 2% (20 MG/ML) 5 ML SYRINGE
INTRAMUSCULAR | Status: AC
  Filled 2017-12-12: qty 5

## 2017-12-12 MED ORDER — LIDOCAINE HCL (CARDIAC) PF 100 MG/5ML IV SOSY
PREFILLED_SYRINGE | INTRAVENOUS | Status: DC | PRN
  Administered 2017-12-12: 80 mg via INTRAVENOUS

## 2017-12-12 MED ORDER — CEFAZOLIN SODIUM 1 G IJ SOLR
INTRAMUSCULAR | Status: AC
  Filled 2017-12-12: qty 20

## 2017-12-12 MED ORDER — FENTANYL CITRATE (PF) 250 MCG/5ML IJ SOLN
INTRAMUSCULAR | Status: DC | PRN
  Administered 2017-12-12: 100 ug via INTRAVENOUS
  Administered 2017-12-12 (×2): 50 ug via INTRAVENOUS

## 2017-12-12 MED ORDER — SCOPOLAMINE 1 MG/3DAYS TD PT72
1.0000 | MEDICATED_PATCH | TRANSDERMAL | Status: DC
  Administered 2017-12-12: 1.5 mg via TRANSDERMAL
  Administered 2017-12-12: 1 via TRANSDERMAL
  Filled 2017-12-12: qty 1

## 2017-12-12 MED ORDER — CEPHALEXIN 500 MG PO CAPS: 500.0000 mg | ORAL_CAPSULE | Freq: Four times a day (QID) | ORAL | 0 refills | Status: AC

## 2017-12-12 MED ORDER — CEFAZOLIN SODIUM-DEXTROSE 2-3 GM-%(50ML) IV SOLR
INTRAVENOUS | Status: DC | PRN
  Administered 2017-12-12: 2 g via INTRAVENOUS

## 2017-12-12 MED ORDER — 0.9 % SODIUM CHLORIDE (POUR BTL) OPTIME
TOPICAL | Status: DC | PRN
  Administered 2017-12-12: 1000 mL

## 2017-12-12 MED ORDER — PROPOFOL 10 MG/ML IV BOLUS
INTRAVENOUS | Status: DC | PRN
  Administered 2017-12-12: 160 mg via INTRAVENOUS

## 2017-12-12 MED ORDER — LACTATED RINGERS IV SOLN
INTRAVENOUS | Status: DC
  Administered 2017-12-12 (×2): via INTRAVENOUS

## 2017-12-12 MED ORDER — PROPOFOL 10 MG/ML IV BOLUS
INTRAVENOUS | Status: AC
  Filled 2017-12-12: qty 20

## 2017-12-12 MED ORDER — PROMETHAZINE HCL 25 MG/ML IJ SOLN: 6.2500 mg | INTRAMUSCULAR | Status: DC | PRN

## 2017-12-12 MED ORDER — ROCURONIUM BROMIDE 50 MG/5ML IV SOLN
INTRAVENOUS | Status: AC
  Filled 2017-12-12: qty 1

## 2017-12-12 MED ORDER — FENTANYL CITRATE (PF) 100 MCG/2ML IJ SOLN
25.0000 ug | INTRAMUSCULAR | Status: DC | PRN
  Administered 2017-12-12: 50 ug via INTRAVENOUS

## 2017-12-12 MED ORDER — PHENYLEPHRINE 40 MCG/ML (10ML) SYRINGE FOR IV PUSH (FOR BLOOD PRESSURE SUPPORT)
PREFILLED_SYRINGE | INTRAVENOUS | Status: AC
  Filled 2017-12-12: qty 10

## 2017-12-12 MED ORDER — DEXAMETHASONE SODIUM PHOSPHATE 10 MG/ML IJ SOLN
INTRAMUSCULAR | Status: AC
  Filled 2017-12-12: qty 1

## 2017-12-12 MED ORDER — DIPHENHYDRAMINE HCL 50 MG/ML IJ SOLN
INTRAMUSCULAR | Status: DC | PRN
  Administered 2017-12-12: 12.5 mg via INTRAVENOUS

## 2017-12-12 MED ORDER — SCOPOLAMINE 1 MG/3DAYS TD PT72
MEDICATED_PATCH | TRANSDERMAL | Status: AC
  Filled 2017-12-12: qty 1

## 2017-12-12 MED ORDER — ONDANSETRON HCL 4 MG/2ML IJ SOLN
INTRAMUSCULAR | Status: AC
  Filled 2017-12-12: qty 2

## 2017-12-12 MED ORDER — FENTANYL CITRATE (PF) 100 MCG/2ML IJ SOLN
INTRAMUSCULAR | Status: AC
  Filled 2017-12-12: qty 2

## 2017-12-12 MED ORDER — SUCCINYLCHOLINE CHLORIDE 20 MG/ML IJ SOLN
INTRAMUSCULAR | Status: DC | PRN
  Administered 2017-12-12: 120 mg via INTRAVENOUS

## 2017-12-12 MED ORDER — PHENYLEPHRINE HCL 10 MG/ML IJ SOLN
INTRAMUSCULAR | Status: DC | PRN
  Administered 2017-12-12: 80 ug via INTRAVENOUS

## 2017-12-12 MED ORDER — MIDAZOLAM HCL 2 MG/2ML IJ SOLN
INTRAMUSCULAR | Status: DC | PRN
  Administered 2017-12-12: 2 mg via INTRAVENOUS

## 2017-12-12 SURGICAL SUPPLY — 44 items
APPLIER CLIP 9.375 MED OPEN (MISCELLANEOUS) ×2
APR CLP MED 9.3 20 MLT OPN (MISCELLANEOUS) ×1
BLADE SURG 15 STRL LF DISP TIS (BLADE) IMPLANT
BLADE SURG 15 STRL SS (BLADE)
CANISTER SUCT 3000ML PPV (MISCELLANEOUS) IMPLANT
CLEANER TIP ELECTROSURG 2X2 (MISCELLANEOUS) ×1 IMPLANT
CLIP APPLIE 9.375 MED OPEN (MISCELLANEOUS) IMPLANT
CONT SPEC 4OZ CLIKSEAL STRL BL (MISCELLANEOUS) IMPLANT
COVER SURGICAL LIGHT HANDLE (MISCELLANEOUS) ×2 IMPLANT
CRADLE DONUT ADULT HEAD (MISCELLANEOUS) IMPLANT
DRAPE HALF SHEET 40X57 (DRAPES) IMPLANT
ELECT COATED BLADE 2.86 ST (ELECTRODE) ×2 IMPLANT
ELECT NDL TIP 2.8 STRL (NEEDLE) IMPLANT
ELECT NEEDLE TIP 2.8 STRL (NEEDLE) IMPLANT
ELECT PAIRED SUBDERMAL (MISCELLANEOUS) ×2
ELECT REM PT RETURN 9FT ADLT (ELECTROSURGICAL) ×2
ELECTRODE PAIRED SUBDERMAL (MISCELLANEOUS) IMPLANT
ELECTRODE REM PT RTRN 9FT ADLT (ELECTROSURGICAL) ×1 IMPLANT
GAUZE SPONGE 4X4 16PLY XRAY LF (GAUZE/BANDAGES/DRESSINGS) IMPLANT
GLOVE BIO SURGEON STRL SZ 6.5 (GLOVE) ×1 IMPLANT
GOWN STRL REUS W/ TWL LRG LVL3 (GOWN DISPOSABLE) ×2 IMPLANT
GOWN STRL REUS W/TWL LRG LVL3 (GOWN DISPOSABLE) ×4
HEMOSTAT ARISTA ABSORB 3G PWDR (MISCELLANEOUS) ×1 IMPLANT
KIT BASIN OR (CUSTOM PROCEDURE TRAY) ×2 IMPLANT
KIT TURNOVER KIT B (KITS) ×2 IMPLANT
NDL HYPO 25GX1X1/2 BEV (NEEDLE) IMPLANT
NEEDLE HYPO 25GX1X1/2 BEV (NEEDLE) IMPLANT
NS IRRIG 1000ML POUR BTL (IV SOLUTION) ×2 IMPLANT
PAD ARMBOARD 7.5X6 YLW CONV (MISCELLANEOUS) ×4 IMPLANT
PENCIL BUTTON HOLSTER BLD 10FT (ELECTRODE) ×2 IMPLANT
STAPLER VISISTAT 35W (STAPLE) ×1 IMPLANT
SUT CHROMIC 4 0 P 3 18 (SUTURE) IMPLANT
SUT ETHILON 4 0 PS 2 18 (SUTURE) IMPLANT
SUT ETHILON 5 0 P 3 18 (SUTURE)
SUT NYLON ETHILON 5-0 P-3 1X18 (SUTURE) IMPLANT
SUT PLAIN 5 0 P 3 18 (SUTURE) ×1 IMPLANT
SUT SILK 4 0 (SUTURE)
SUT SILK 4-0 18XBRD TIE 12 (SUTURE) IMPLANT
SUT VIC AB 3-0 SH 27 (SUTURE) ×2
SUT VIC AB 3-0 SH 27X BRD (SUTURE) IMPLANT
TOWEL NATURAL 6PK STERILE (DISPOSABLE) ×2 IMPLANT
TRAY ENT MC OR (CUSTOM PROCEDURE TRAY) ×2 IMPLANT
TUBE ENDOTRAC EMG 7X10.2 (MISCELLANEOUS) ×1 IMPLANT
WATER STERILE IRR 1000ML POUR (IV SOLUTION) ×2 IMPLANT

## 2017-12-12 NOTE — Transfer of Care (Signed)
Immediate Anesthesia Transfer of Care Note  Patient: Hannah Miranda  Procedure(s) Performed: RIGHT BRANCHIAL CLEFT CYST EXCISION (Right Neck)  Patient Location: PACU  Anesthesia Type:General  Level of Consciousness: awake, alert , oriented and patient cooperative  Airway & Oxygen Therapy: Patient Spontanous Breathing and Patient connected to nasal cannula oxygen  Post-op Assessment: Report given to RN and Post -op Vital signs reviewed and stable  Post vital signs: Reviewed and stable  Last Vitals:  Vitals Value Taken Time  BP 133/84 12/12/2017 12:50 PM  Temp    Pulse 93 12/12/2017 12:52 PM  Resp 12 12/12/2017 12:52 PM  SpO2 100 % 12/12/2017 12:52 PM  Vitals shown include unvalidated device data.  Last Pain:  Vitals:   12/12/17 0850  TempSrc:   PainSc: 0-No pain      Patients Stated Pain Goal: 2 (12/12/17 0850)  Complications: No apparent anesthesia complications

## 2017-12-12 NOTE — Anesthesia Preprocedure Evaluation (Addendum)
Anesthesia Evaluation  Patient identified by MRN, date of birth, ID band Patient awake    Reviewed: Allergy & Precautions, NPO status , Patient's Chart, lab work & pertinent test results  History of Anesthesia Complications Negative for: history of anesthetic complications  Airway Mallampati: II  TM Distance: >3 FB Neck ROM: Full    Dental no notable dental hx. (+) Dental Advisory Given   Pulmonary neg pulmonary ROS,    Pulmonary exam normal        Cardiovascular hypertension, Normal cardiovascular exam     Neuro/Psych  Headaches, PSYCHIATRIC DISORDERS Anxiety Depression    GI/Hepatic Neg liver ROS, hiatal hernia, GERD  ,  Endo/Other  negative endocrine ROS  Renal/GU negative Renal ROS     Musculoskeletal   Abdominal   Peds  Hematology negative hematology ROS (+)   Anesthesia Other Findings   Reproductive/Obstetrics                            Anesthesia Physical Anesthesia Plan  ASA: II  Anesthesia Plan: General   Post-op Pain Management:    Induction: Intravenous  PONV Risk Score and Plan: 4 or greater and Ondansetron, Dexamethasone, Scopolamine patch - Pre-op and Diphenhydramine  Airway Management Planned: Oral ETT  Additional Equipment:   Intra-op Plan:   Post-operative Plan: Extubation in OR  Informed Consent: I have reviewed the patients History and Physical, chart, labs and discussed the procedure including the risks, benefits and alternatives for the proposed anesthesia with the patient or authorized representative who has indicated his/her understanding and acceptance.   Dental advisory given  Plan Discussed with: Anesthesiologist and CRNA  Anesthesia Plan Comments:        Anesthesia Quick Evaluation

## 2017-12-12 NOTE — H&P (Signed)
The surgical history remains accurate and without interval change. The condition still exists which makes the procedure necessary. The patient and/or family is aware of their condition and has been informed of the risks and benefits of surgery, as well as alternatives. All parties have elected to proceed with surgery.   Surgical plan: excision right branchial cleft cyst  Subjective: Ms. Hannah Miranda is a 52 y.o. female kindly referred by Cleda Daub, MD for evaluation of right submandibular mass, first noted a few years ago. Has been enlarging recently. Non-tender, not painful. Never been infected. Feels "tight" to her. There is some new dysphagia and food sticking sensations on right side of her throat. Mad CT scan done over a year ago (see below).  Denies otalgia, voice changes, throat pain. Has had 10lbs weight loss recently on the keto diet.  Never smoker. EtOH- 2 drinks per week.   Past Medical History:  Diagnosis Date  . Anxiety  . Hypertension   No past surgical history on file. No family history on file. No bleeding disorders or anesthesia reactions.  Social History   Social History  . Marital status: Married  Spouse name: N/A  . Number of children: N/A  . Years of education: N/A   Occupational History  . Not on file.   Social History Main Topics  . Smoking status: Never Smoker  . Smokeless tobacco: Never Used  . Alcohol use Yes  Comment: Social  . Drug use: Unknown  . Sexual activity: Not on file   Other Topics Concern  . Not on file   Social History Narrative  . No narrative on file   Allergies  Allergen Reactions  . Latex Anaphylaxis (ALLERGY)   Updated Medication List:   diclofenac sodium (PENNSAID) 20 mg/gram /actuation(2 %) sopm  Sig - Route: Place onto the skin. - Transdermal  Class: Historical Med  montelukast (SINGULAIR) 10 mg tablet  Sig: TAKE 1 TABLET BY MOUTH AS NEEDED FOR SPRING AND FALL  Class: Historical Med  ALPRAZolam (XANAX)  0.5 MG tablet  Sig: TAKE 1 TABLET BY MOUTH TWICE A DAY AS NEEDED  Class: Historical Med  cetirizine (ZYRTEC) 10 MG tablet  Sig - Route: Take 10 mg by mouth. - Oral  Class: Historical Med  mometasone (NASONEX) 50 mcg/actuation nasal spray  Sig - Route: 2 sprays by Nasal route. - Nasal  Class: Historical Med  naproxen (NAPROSYN) 375 MG tablet  Sig: TAKE 1 TABLET (375 MG TOTAL) BY MOUTH 2 (TWO) TIMES DAILY WITH A MEAL.  Class: Historical Med  omeprazole (PRILOSEC) 20 MG capsule  Sig: TAKE 1 CAPSULE BY MOUTH EVERY DAY  Class: Historical Med    ROS A complete review of systems was conducted and was negative except as stated in the HPI.   Objective:  Vitals:   12/12/17 0842  BP: (!) 145/89  Pulse: 77  Resp: 20  Temp: 98.5 F (36.9 C)  SpO2: 96%    Physical Exam:  General Normocephalic, Awake, Alert and appropriate for the exam  Eyes PERRL, no scleral icterus or conjunctival hemorrhage.  EOMI.  Ears Right ear- EAC patent, no obstructing cerumen. TM: intact, no effusion, no retraction, normal landmarks Left ear- EAC patent, no obstructing cerumen. TM: intact, no effusion, no retraction, normal landmarks  Nose Patent, No polyps or masses seen.  Oral Pharynx No mucosal lesions or tumors seen. Dentition is grossly normal.  Mirror laryngeal examination reveals crisp epiglottis and mobile arytenoids. Cannot fully visualize glottis or vallecula.  Lymphatics right submandibular region with a soft mobile but large approximately 4 cm mass.  Endocrine No thyroidmegaly, no thyroid masses palpated  Cardio-vascular No cyanosis, regular rate  Pulmonary No audible stridor, Breathing easily with no labor. No dysphonia.  Neuro Symmetric facial movement.  Tongue protrudes in midline.  Psychiatry Appropriate affect and mood for clinic visit.  Skin No scars or lesions on face or neck.    CT neck 08/2016 personally reviewed-my interpretation of the study is right cystic-appearing,  well-circumscribed, unilocular neck mass in level 2.     Assessment:  My impression is that Denitra has  1. Mass of right side of neck  .Cystic right neck mass, biopsied today with copious return of clear-yellow fluid. 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

## 2017-12-12 NOTE — Anesthesia Postprocedure Evaluation (Signed)
Anesthesia Post Note  Patient: Hannah Miranda  Procedure(s) Performed: RIGHT BRANCHIAL CLEFT CYST EXCISION (Right Neck)     Patient location during evaluation: PACU Anesthesia Type: General Level of consciousness: sedated Pain management: pain level controlled Vital Signs Assessment: post-procedure vital signs reviewed and stable Respiratory status: spontaneous breathing and respiratory function stable Cardiovascular status: stable Postop Assessment: no apparent nausea or vomiting Anesthetic complications: no    Last Vitals:  Vitals:   12/12/17 1350 12/12/17 1352  BP:  139/82  Pulse: 85 80  Resp: 17 15  Temp:  36.8 C  SpO2: 96% 97%    Last Pain:  Vitals:   12/12/17 1323  TempSrc:   PainSc: 3                  [name]

## 2017-12-12 NOTE — Anesthesia Procedure Notes (Signed)
Procedure Name: Intubation Date/Time: 12/12/2017 11:40 AM Performed by: Raenette Rover, CRNA Pre-anesthesia Checklist: Patient identified, Emergency Drugs available, Suction available and Patient being monitored Patient Re-evaluated:Patient Re-evaluated prior to induction Oxygen Delivery Method: Circle system utilized Preoxygenation: Pre-oxygenation with 100% oxygen Induction Type: IV induction Ventilation: Mask ventilation without difficulty Laryngoscope Size: Mac and 3 Grade View: Grade I Tube type: Oral Tube size: 7.0 mm Number of attempts: 1 Airway Equipment and Method: Stylet Placement Confirmation: ETT inserted through vocal cords under direct vision,  positive ETCO2,  CO2 detector and breath sounds checked- equal and bilateral Secured at: 21 cm Tube secured with: Tape Dental Injury: Teeth and Oropharynx as per pre-operative assessment

## 2017-12-12 NOTE — Discharge Instructions (Addendum)
-  Cleanse wound with 1/2 strength hydrogen peroxide on a Q tip twice daily. Pat dry. Apply bacitracin ointment along incision. Do this twice daily for one week, then stop. -You may shower like you normally would tomorrow afternoon. Do not scrub over incision. Pat dry.  -No strenuous exercise or heavy lifting until 14 days after surgery. Walk/ambulate at home often. Perform calf and stretch exercises often to prevent blood clot formation.  -Do not take more pain medication than prescribed. You should begin slowly weaning off the pain medication. No refills will be prescribed.

## 2017-12-12 NOTE — Op Note (Signed)
DATE OF PROCEDURE:  12/12/2017    PRE-OPERATIVE DIAGNOSIS:  right branchial cleft cyst    POST-OPERATIVE DIAGNOSIS:  Same    PROCEDURE(S): Excision right branchial cleft cyst   SURGEON:  Misty Stanley, MD    ASSISTANT(S): Flo Shanks, MD    ANESTHESIA: General endotracheal anesthesia      ESTIMATED BLOOD LOSS:  20 mL  SPECIMENS:  Right neck cyst    COMPLICATIONS: None   OPERATIVE FINDINGS: Large, ~4cm cystic neck mass most consistent with branchial cleft cyst posterior to the right submandibular gland. This was not adherent to surrounding soft tissues. The mass was soft, mobile, and had no concerning features. The spinal accessory nerve was identified and preserved. The right marginal mandibular nerve was preserved.    OPERATIVE DETAILS:  The patient was brought to the operating room and placed in the supine position. General anesthesia was induced. A shoulder roll was placed. A NIM was placed around the right mouth and found to be tested and functional. The skin was prepped and draped in the usual sterile fashion. A horizontal neck incision was made in a natural neck crease at least 2 fingerbreadths below the mandible. Sharp dissection was carried through skin, soft tissue, and below platysma. Hugging the platysma, flaps were elevated superiorly and inferiorly. The cyst was encountered and dissected bluntly, taking care to tie off any potential blood vessels. The cyst was not at all adherent to deeper tissues. The cyst tract was found to be superiorly adjacent to the SCM, but not involving any deeper structures. The cyst fluid was encountered and was noted to be clear/pale white-yellow in color.  Lastly, the cyst was freed from surrounding soft tissue and passed off the field. Hemostasis was achieved with electrocautery. Copious irrigation was performed of the wound bed. Arista was placed into the wound bed. Platysma was re-approximated with interrupted 3-0  Vicryl sutures. Skin was closed with 5-0 fast absorbing gut in a running, locking fashion. The skin was cleansed and bacitracin applied. All instrumentation was removed and the patient was awakened and transported to PACU in good condition.

## 2017-12-13 ENCOUNTER — Encounter (HOSPITAL_COMMUNITY): Payer: Self-pay | Admitting: Otolaryngology

## 2017-12-17 ENCOUNTER — Other Ambulatory Visit: Payer: Self-pay | Admitting: Internal Medicine

## 2017-12-17 DIAGNOSIS — F411 Generalized anxiety disorder: Secondary | ICD-10-CM

## 2017-12-17 NOTE — Telephone Encounter (Signed)
Done erx 

## 2017-12-17 NOTE — Telephone Encounter (Signed)
Can you advise in PCP absence.  

## 2018-01-07 ENCOUNTER — Telehealth: Payer: Self-pay | Admitting: Internal Medicine

## 2018-01-07 NOTE — Telephone Encounter (Signed)
Copied from CRM [id number]. Topic: Quick Communication - See Telephone Encounter >> Jan 07, 2018  1:35 PM Lorrine KinMcGee, Rosamond Andress B, NT wrote: CRM for notification. See Telephone encounter for: 01/07/18. Patient calling and states that her leg is starting to bother her again. States she has seen Dr Yetta BarreJones and states that the nerve pain has gotten much worse. It is from her knee to her hip. Has numbness and burning. Can stand and walk for maybe 10 minutes and then has to sit down. Was wondering if a cortizone shot could be given and if it would help? CB#: [phone]

## 2018-01-08 NOTE — Telephone Encounter (Signed)
Patient would need to be evaluated by PCP or Sports Med, whichever patient prefers.

## 2018-01-08 NOTE — Telephone Encounter (Signed)
Left message for patient to call back to schedule. I recommended her seeing Sports Medicine. (Dr Katrinka BlazingSmith, Dr Jordan LikesSchmitz or Dr Berline Choughigby - whoever is first available or what fits with her schedule)

## 2018-01-31 ENCOUNTER — Encounter: Payer: Self-pay | Admitting: Internal Medicine

## 2018-02-25 ENCOUNTER — Other Ambulatory Visit (HOSPITAL_COMMUNITY)
Admission: RE | Admit: 2018-02-25 | Discharge: 2018-02-25 | Disposition: A | Discharge status: Home / Self Care | Payer: BLUE CROSS/BLUE SHIELD | Source: Ambulatory Visit | Attending: Obstetrics and Gynecology | Admitting: Obstetrics and Gynecology

## 2018-02-25 ENCOUNTER — Other Ambulatory Visit: Payer: Self-pay | Admitting: Obstetrics and Gynecology

## 2018-02-25 DIAGNOSIS — Z01419 Encounter for gynecological examination (general) (routine) without abnormal findings: Secondary | ICD-10-CM | POA: Diagnosis not present

## 2018-02-25 DIAGNOSIS — Z01411 Encounter for gynecological examination (general) (routine) with abnormal findings: Secondary | ICD-10-CM | POA: Insufficient documentation

## 2018-02-26 LAB — CYTOLOGY - PAP
Diagnosis: NEGATIVE
HPV (WINDOPATH): NOT DETECTED

## 2018-03-05 ENCOUNTER — Other Ambulatory Visit: Payer: Self-pay | Admitting: Internal Medicine

## 2018-03-05 DIAGNOSIS — F411 Generalized anxiety disorder: Secondary | ICD-10-CM

## 2018-03-06 MED ORDER — ALPRAZOLAM 0.5 MG PO TABS: 0.5000 mg | ORAL_TABLET | Freq: Two times a day (BID) | ORAL | 0 refills | Status: DC | PRN

## 2018-03-06 NOTE — Telephone Encounter (Signed)
[place] controlled substance database checked.  Ok to fill medication.  

## 2018-03-06 NOTE — Telephone Encounter (Signed)
Can you advise in PCP absence.  

## 2018-03-11 ENCOUNTER — Encounter: Payer: Self-pay | Admitting: Internal Medicine

## 2018-04-03 ENCOUNTER — Other Ambulatory Visit (INDEPENDENT_AMBULATORY_CARE_PROVIDER_SITE_OTHER): Payer: BLUE CROSS/BLUE SHIELD

## 2018-04-03 ENCOUNTER — Encounter: Payer: Self-pay | Admitting: Internal Medicine

## 2018-04-03 ENCOUNTER — Ambulatory Visit: Payer: BLUE CROSS/BLUE SHIELD | Admitting: Internal Medicine

## 2018-04-03 VITALS — BP 130/80 | HR 84 | Temp 98.8°F | Ht 63.0 in | Wt 180.8 lb

## 2018-04-03 DIAGNOSIS — R739 Hyperglycemia, unspecified: Secondary | ICD-10-CM

## 2018-04-03 DIAGNOSIS — Z0000 Encounter for general adult medical examination without abnormal findings: Secondary | ICD-10-CM | POA: Diagnosis not present

## 2018-04-03 DIAGNOSIS — I10 Essential (primary) hypertension: Secondary | ICD-10-CM

## 2018-04-03 DIAGNOSIS — M2550 Pain in unspecified joint: Secondary | ICD-10-CM

## 2018-04-03 DIAGNOSIS — F411 Generalized anxiety disorder: Secondary | ICD-10-CM

## 2018-04-03 DIAGNOSIS — Z23 Encounter for immunization: Secondary | ICD-10-CM | POA: Diagnosis not present

## 2018-04-03 DIAGNOSIS — M5030 Other cervical disc degeneration, unspecified cervical region: Secondary | ICD-10-CM | POA: Diagnosis not present

## 2018-04-03 DIAGNOSIS — M5412 Radiculopathy, cervical region: Secondary | ICD-10-CM | POA: Diagnosis not present

## 2018-04-03 DIAGNOSIS — Z1231 Encounter for screening mammogram for malignant neoplasm of breast: Secondary | ICD-10-CM

## 2018-04-03 DIAGNOSIS — E785 Hyperlipidemia, unspecified: Secondary | ICD-10-CM

## 2018-04-03 LAB — LIPID PANEL
CHOLESTEROL: 184 mg/dL (ref 0–200)
HDL: 46.3 mg/dL (ref >39.00)
LDL CALC: 119 mg/dL — AB (ref 0–99)
NonHDL: 137.92
TRIGLYCERIDES: 94 mg/dL (ref 0.0–149.0)
Total CHOL/HDL Ratio: 4
VLDL: 18.8 mg/dL (ref 0.0–40.0)

## 2018-04-03 LAB — BASIC METABOLIC PANEL
BUN: 10 mg/dL (ref 6–23)
CHLORIDE: 105 meq/L (ref 96–112)
CO2: 25 mEq/L (ref 19–32)
CREATININE: 0.67 mg/dL (ref 0.40–1.20)
Calcium: 9.1 mg/dL (ref 8.4–10.5)
GFR: 98.43 mL/min (ref >60.00)
Glucose, Bld: 103 mg/dL — ABNORMAL HIGH (ref 70–99)
Potassium: 4.1 mEq/L (ref 3.5–5.1)
Sodium: 139 mEq/L (ref 135–145)

## 2018-04-03 LAB — HEMOGLOBIN A1C: HEMOGLOBIN A1C: 5.4 % (ref 4.6–6.5)

## 2018-04-03 MED ORDER — NAPROXEN 375 MG PO TABS: 375.0000 mg | ORAL_TABLET | Freq: Two times a day (BID) | ORAL | 3 refills | Status: DC | PRN

## 2018-04-03 MED ORDER — ALPRAZOLAM 0.5 MG PO TABS: 0.5000 mg | ORAL_TABLET | Freq: Two times a day (BID) | ORAL | 3 refills | Status: DC | PRN

## 2018-04-03 NOTE — Patient Instructions (Signed)

## 2018-04-03 NOTE — Progress Notes (Signed)
Subjective:  Patient ID: Hannah Miranda, female    DOB: 09/23/1965  Age: 52 y.o. MRN: [id number]  CC: Annual Exam   HPI Hannah Miranda presents for a CPX.  She continues to complain of musculoskeletal pain especially in her left thigh.  She was previously diagnosed with meralgia paresthetica and referred to physical therapy.  She decided not to do physical therapy.  He gets symptom relief with naproxen.  She continues to struggle with anxiety.  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  She wants a refill on Xanax.  Outpatient Medications Prior to Visit  Medication Sig Dispense Refill  . cetirizine (ZYRTEC) 10 MG tablet Take 10 mg by mouth daily.    . cholecalciferol (VITAMIN D) 1000 units tablet Take 1,000 Units by mouth daily.    Marland Kitchen MAGNESIUM PO Take 1 tablet by mouth 2 (two) times a week. At night    . Melatonin 2.5 MG CHEW Chew 2.5 mg by mouth at bedtime as needed (for sleep.).    Marland Kitchen mometasone (NASONEX) 50 MCG/ACT nasal spray Place 1 spray into the nose at bedtime as needed (for allergies).     . montelukast (SINGULAIR) 10 MG tablet TAKE 1 TABLET BY MOUTH AS NEEDED FOR SPRING AND FALL 90 tablet 1  . omeprazole (PRILOSEC) 20 MG capsule Take 1 capsule (20 mg total) by mouth daily. (Patient taking differently: Take 20 mg by mouth daily as needed (for indigestion/heartburn.). ) 90 capsule 1  . Soft Lens Products (REWETTING DROPS) SOLN Place 1 drop into both eyes daily.    . vitamin B-12 (CYANOCOBALAMIN) 500 MCG tablet Take 500 mcg by mouth daily.    Marland Kitchen ALPRAZolam (XANAX) 0.5 MG tablet Take 1 tablet (0.5 mg total) by mouth 2 (two) times daily as needed. 60 tablet 0  . naproxen (NAPROSYN) 375 MG tablet Take 1 tablet (375 mg total) by mouth 2 (two) times daily with a meal. (Patient taking differently: Take 375 mg by mouth 2 (two) times daily as needed (for pain/inflammation.). ) 60 tablet 3  . Diclofenac Sodium (PENNSAID) 2 % SOLN Place 2 application onto the skin 2 (two)  times daily. 112 g 3   No facility-administered medications prior to visit.     ROS Review of Systems  Constitutional: Negative for activity change, diaphoresis and fatigue.  HENT: Negative.   Eyes: Negative for visual disturbance.  Respiratory: Negative for cough, chest tightness, shortness of breath and wheezing.   Cardiovascular: Negative for chest pain, palpitations and leg swelling.  Gastrointestinal: Negative for abdominal pain, constipation, diarrhea, nausea and vomiting.  Genitourinary: Negative.  Negative for difficulty urinating.  Musculoskeletal: Positive for arthralgias and neck pain. Negative for back pain and myalgias.  Skin: Negative.   Allergic/Immunologic: Negative.   Neurological: Negative.  Negative for dizziness.  Hematological: Negative for adenopathy. Does not bruise/bleed easily.  Psychiatric/Behavioral: Negative for behavioral problems, decreased concentration, dysphoric mood, sleep disturbance and suicidal ideas. The patient is nervous/anxious.     Objective:  BP 130/80 (BP Location: Left Arm, Patient Position: Sitting, Cuff Size: Normal)   Pulse 84   Temp 98.8 F (37.1 C) (Oral)   Ht 5\' 3"  (1.6 m)   Wt 180 lb 12 oz (82 kg)   SpO2 98%   BMI 32.02 kg/m   BP Readings from Last 3 Encounters:  04/03/18 130/80  12/12/17 139/82  12/05/17 (!) 151/94    Wt Readings from Last 3 Encounters:  04/03/18 180 lb 12 oz (  82 kg)  12/12/17 175 lb (79.4 kg)  12/05/17 175 lb 11.2 oz (79.7 kg)    Physical Exam  Constitutional: She is oriented to person, place, and time. No distress.  HENT:  Mouth/Throat: Oropharynx is clear and moist. No oropharyngeal exudate.  Eyes: Conjunctivae are normal. No scleral icterus.  Neck: Normal range of motion. Neck supple. No JVD present. No thyromegaly present.  Cardiovascular: Normal rate, regular rhythm and normal heart sounds. Exam reveals no gallop.  No murmur heard. Pulmonary/Chest: Effort normal and breath sounds normal.  No respiratory distress. She has no wheezes. She has no rales.  Abdominal: Soft. Normal appearance and bowel sounds are normal. She exhibits no mass. There is no hepatosplenomegaly. There is no tenderness.  Musculoskeletal: Normal range of motion. She exhibits no edema, tenderness or deformity.  Lymphadenopathy:    She has no cervical adenopathy.  Neurological: She is alert and oriented to person, place, and time.  Skin: Skin is warm and dry. She is not diaphoretic. No pallor.  Psychiatric: She has a normal mood and affect. Her behavior is normal. Judgment and thought content normal.  Vitals reviewed.   Lab Results  Component Value Date   WBC 6.0 12/05/2017   HGB 13.3 12/05/2017   HCT 39.6 12/05/2017   PLT 219 12/05/2017   GLUCOSE 103 (H) 04/03/2018   CHOL 184 04/03/2018   TRIG 94.0 04/03/2018   HDL 46.30 04/03/2018   LDLCALC 119 (H) 04/03/2018   ALT 14 04/10/2017   AST 16 04/10/2017   NA 139 04/03/2018   K 4.1 04/03/2018   CL 105 04/03/2018   CREATININE 0.67 04/03/2018   BUN 10 04/03/2018   CO2 25 04/03/2018   TSH 1.42 11/28/2016   HGBA1C 5.4 04/03/2018    No results found.  Assessment & Plan:   Hannah Miranda was seen today for annual exam.  Diagnoses and all orders for this visit:  Essential hypertension- Her blood pressure is well controlled.  Medical therapy is not indicated.  Need for influenza vaccination -     Flu Vaccine QUAD 36+ mos IM  Radiculitis of left cervical region -     naproxen (NAPROSYN) 375 MG tablet; Take 1 tablet (375 mg total) by mouth 2 (two) times daily as needed (for pain/inflammation.).  DDD (degenerative disc disease), cervical -     naproxen (NAPROSYN) 375 MG tablet; Take 1 tablet (375 mg total) by mouth 2 (two) times daily as needed (for pain/inflammation.).  Arthralgia, unspecified joint -     naproxen (NAPROSYN) 375 MG tablet; Take 1 tablet (375 mg total) by mouth 2 (two) times daily as needed (for pain/inflammation.).  GAD (generalized  anxiety disorder) -     ALPRAZolam (XANAX) 0.5 MG tablet; Take 1 tablet (0.5 mg total) by mouth 2 (two) times daily as needed.  Routine general medical examination at a health care facility- Exam completed, labs reviewed, vaccines reviewed and updated, screening for colon cancer is up-to-date, she is referred for screening mammogram. -     Lipid panel; Future  Hyperlipidemia LDL goal <130- She does not have an elevated ASCVD risk score so I do not recommend a statin for CV risk reduction.  Hyperglycemia- Improvement noted. -     Basic metabolic panel; Future -     Hemoglobin A1c; Future  Breast cancer screening by mammogram -     Cancel: MM DIGITAL SCREENING BILATERAL; Future -     MM 3D SCREEN BREAST BILATERAL; Future   I have discontinued Hannah LandAngela  Leotis Miranda "Chris"'s Diclofenac Sodium. I have also changed her naproxen. Additionally, I am having her maintain her cetirizine, mometasone, omeprazole, montelukast, cholecalciferol, vitamin B-12, MAGNESIUM PO, Melatonin, REWETTING DROPS, and ALPRAZolam.  Meds ordered this encounter  Medications  . naproxen (NAPROSYN) 375 MG tablet    Sig: Take 1 tablet (375 mg total) by mouth 2 (two) times daily as needed (for pain/inflammation.).    Dispense:  60 tablet    Refill:  3  . ALPRAZolam (XANAX) 0.5 MG tablet    Sig: Take 1 tablet (0.5 mg total) by mouth 2 (two) times daily as needed.    Dispense:  60 tablet    Refill:  3    Not to exceed 3 additional fills before 12/29/2017     Follow-up: Return in about 6 months (around 10/04/2018).  Sanda Linger, MD

## 2018-05-08 DIAGNOSIS — H01111 Allergic dermatitis of right upper eyelid: Secondary | ICD-10-CM | POA: Diagnosis not present

## 2018-06-04 ENCOUNTER — Other Ambulatory Visit: Payer: Self-pay | Admitting: Internal Medicine

## 2018-06-04 DIAGNOSIS — Z1231 Encounter for screening mammogram for malignant neoplasm of breast: Secondary | ICD-10-CM

## 2018-07-02 ENCOUNTER — Ambulatory Visit
Admission: RE | Admit: 2018-07-02 | Discharge: 2018-07-02 | Disposition: A | Discharge status: Home / Self Care | Payer: BLUE CROSS/BLUE SHIELD | Source: Ambulatory Visit | Attending: Internal Medicine | Admitting: Internal Medicine

## 2018-07-02 DIAGNOSIS — Z1231 Encounter for screening mammogram for malignant neoplasm of breast: Secondary | ICD-10-CM

## 2018-07-03 LAB — HM MAMMOGRAPHY

## 2018-07-30 ENCOUNTER — Other Ambulatory Visit: Payer: Self-pay | Admitting: Internal Medicine

## 2018-07-30 DIAGNOSIS — M5030 Other cervical disc degeneration, unspecified cervical region: Secondary | ICD-10-CM

## 2018-07-30 DIAGNOSIS — M2550 Pain in unspecified joint: Secondary | ICD-10-CM

## 2018-07-30 DIAGNOSIS — M5412 Radiculopathy, cervical region: Secondary | ICD-10-CM

## 2018-10-23 ENCOUNTER — Other Ambulatory Visit: Payer: Self-pay | Admitting: Internal Medicine

## 2018-10-23 DIAGNOSIS — K219 Gastro-esophageal reflux disease without esophagitis: Secondary | ICD-10-CM

## 2019-06-18 ENCOUNTER — Other Ambulatory Visit: Payer: Self-pay

## 2019-06-18 DIAGNOSIS — Z20822 Contact with and (suspected) exposure to covid-19: Secondary | ICD-10-CM

## 2019-06-20 LAB — NOVEL CORONAVIRUS, NAA: SARS-CoV-2, NAA: NOT DETECTED

## 2022-08-24 ENCOUNTER — Encounter: Payer: Self-pay | Admitting: Internal Medicine

## 2022-08-24 ENCOUNTER — Ambulatory Visit: Payer: BC Managed Care – PPO | Admitting: Internal Medicine

## 2022-08-24 VITALS — BP 144/92 | HR 86 | Temp 98.1°F | Ht 63.0 in | Wt 190.0 lb

## 2022-08-24 DIAGNOSIS — Z1159 Encounter for screening for other viral diseases: Secondary | ICD-10-CM | POA: Diagnosis not present

## 2022-08-24 DIAGNOSIS — I10 Essential (primary) hypertension: Secondary | ICD-10-CM | POA: Diagnosis not present

## 2022-08-24 DIAGNOSIS — E785 Hyperlipidemia, unspecified: Secondary | ICD-10-CM | POA: Diagnosis not present

## 2022-08-24 DIAGNOSIS — Z0001 Encounter for general adult medical examination with abnormal findings: Secondary | ICD-10-CM

## 2022-08-24 DIAGNOSIS — R739 Hyperglycemia, unspecified: Secondary | ICD-10-CM

## 2022-08-24 DIAGNOSIS — Z1211 Encounter for screening for malignant neoplasm of colon: Secondary | ICD-10-CM

## 2022-08-24 DIAGNOSIS — Z1231 Encounter for screening mammogram for malignant neoplasm of breast: Secondary | ICD-10-CM

## 2022-08-24 DIAGNOSIS — Z124 Encounter for screening for malignant neoplasm of cervix: Secondary | ICD-10-CM | POA: Insufficient documentation

## 2022-08-24 LAB — HEPATIC FUNCTION PANEL
ALT: 15 U/L (ref 0–35)
AST: 19 U/L (ref 0–37)
Albumin: 4.4 g/dL (ref 3.5–5.2)
Alkaline Phosphatase: 87 U/L (ref 39–117)
Bilirubin, Direct: 0.1 mg/dL (ref 0.0–0.3)
Total Bilirubin: 0.4 mg/dL (ref 0.2–1.2)
Total Protein: 7.4 g/dL (ref 6.0–8.3)

## 2022-08-24 LAB — CBC WITH DIFFERENTIAL/PLATELET
Basophils Absolute: 0.1 10*3/uL (ref 0.0–0.1)
Basophils Relative: 0.9 % (ref 0.0–3.0)
Eosinophils Absolute: 0.1 10*3/uL (ref 0.0–0.7)
Eosinophils Relative: 1.8 % (ref 0.0–5.0)
HCT: 40.6 % (ref 36.0–46.0)
Hemoglobin: 14.2 g/dL (ref 12.0–15.0)
Lymphocytes Relative: 20.8 % (ref 12.0–46.0)
Lymphs Abs: 1.4 10*3/uL (ref 0.7–4.0)
MCHC: 35.1 g/dL (ref 30.0–36.0)
MCV: 85.6 fl (ref 78.0–100.0)
Monocytes Absolute: 0.5 10*3/uL (ref 0.1–1.0)
Monocytes Relative: 7.4 % (ref 3.0–12.0)
Neutro Abs: 4.5 10*3/uL (ref 1.4–7.7)
Neutrophils Relative %: 69.1 % (ref 43.0–77.0)
Platelets: 256 10*3/uL (ref 150.0–400.0)
RBC: 4.74 Mil/uL (ref 3.87–5.11)
RDW: 12.6 % (ref 11.5–15.5)
WBC: 6.5 10*3/uL (ref 4.0–10.5)

## 2022-08-24 LAB — URINALYSIS, ROUTINE W REFLEX MICROSCOPIC
Bilirubin Urine: NEGATIVE
Hgb urine dipstick: NEGATIVE
Ketones, ur: NEGATIVE
Leukocytes,Ua: NEGATIVE
Nitrite: NEGATIVE
RBC / HPF: NONE SEEN (ref 0–?)
Specific Gravity, Urine: <=1.005 — AB (ref 1.000–1.030)
Total Protein, Urine: NEGATIVE
Urine Glucose: NEGATIVE
Urobilinogen, UA: 0.2 (ref 0.0–1.0)
WBC, UA: NONE SEEN (ref 0–?)
pH: 6 (ref 5.0–8.0)

## 2022-08-24 LAB — BASIC METABOLIC PANEL
BUN: 13 mg/dL (ref 6–23)
CO2: 28 mEq/L (ref 19–32)
Calcium: 9.5 mg/dL (ref 8.4–10.5)
Chloride: 104 mEq/L (ref 96–112)
Creatinine, Ser: 0.71 mg/dL (ref 0.40–1.20)
GFR: 95.4 mL/min (ref >60.00)
Glucose, Bld: 104 mg/dL — ABNORMAL HIGH (ref 70–99)
Potassium: 4.5 mEq/L (ref 3.5–5.1)
Sodium: 140 mEq/L (ref 135–145)

## 2022-08-24 LAB — LIPID PANEL
Cholesterol: 200 mg/dL (ref 0–200)
HDL: 47 mg/dL (ref >39.00)
LDL Cholesterol: 130 mg/dL — ABNORMAL HIGH (ref 0–99)
NonHDL: 152.67
Total CHOL/HDL Ratio: 4
Triglycerides: 111 mg/dL (ref 0.0–149.0)
VLDL: 22.2 mg/dL (ref 0.0–40.0)

## 2022-08-24 LAB — TSH: TSH: 2.06 u[IU]/mL (ref 0.35–5.50)

## 2022-08-24 LAB — HEMOGLOBIN A1C: Hgb A1c MFr Bld: 5.6 % (ref 4.6–6.5)

## 2022-08-24 NOTE — Progress Notes (Signed)
Subjective:  Patient ID: Hannah Miranda, female    DOB: October 14, 1965  Age: 57 y.o. MRN: [id number]  CC: Annual Exam and Hypertension   HPI Hannah Miranda presents for a CPX and to re-establish.  She walks about 2 miles a day.  She has good endurance and denies chest pain, shortness of breath, diaphoresis, or edema.  Outpatient Medications Prior to Visit  Medication Sig Dispense Refill   cholecalciferol (VITAMIN D) 1000 units tablet Take 1,000 Units by mouth daily.     MAGNESIUM PO Take 1 tablet by mouth 2 (two) times a week. At night     Melatonin 2.5 MG CHEW Chew 2.5 mg by mouth at bedtime as needed (for sleep.).     Soft Lens Products (REWETTING DROPS) SOLN Place 1 drop into both eyes daily.     vitamin B-12 (CYANOCOBALAMIN) 500 MCG tablet Take 500 mcg by mouth daily.     ALPRAZolam (XANAX) 0.5 MG tablet Take 1 tablet (0.5 mg total) by mouth 2 (two) times daily as needed. 60 tablet 3   cetirizine (ZYRTEC) 10 MG tablet Take 10 mg by mouth daily.     mometasone (NASONEX) 50 MCG/ACT nasal spray Place 1 spray into the nose at bedtime as needed (for allergies).      montelukast (SINGULAIR) 10 MG tablet TAKE 1 TABLET BY MOUTH AS NEEDED FOR SPRING AND FALL 90 tablet 1   naproxen (NAPROSYN) 375 MG tablet TAKE 1 TABLET (375 MG TOTAL) BY MOUTH 2 (TWO) TIMES DAILY AS NEEDED (FOR PAIN/INFLAMMATION.). 60 tablet 0   omeprazole (PRILOSEC) 20 MG capsule Take 1 capsule (20 mg total) by mouth daily as needed (for indigestion/heartburn.). 90 capsule 1   No facility-administered medications prior to visit.    ROS Review of Systems  Constitutional:  Positive for unexpected weight change (wt gain).  Eyes: Negative.   Respiratory: Negative.  Negative for cough, chest tightness, shortness of breath and wheezing.   Cardiovascular:  Negative for chest pain, palpitations and leg swelling.  Gastrointestinal:  Negative for abdominal pain, blood in stool, constipation and diarrhea.  Endocrine: Negative.    Genitourinary: Negative.  Negative for difficulty urinating.  Musculoskeletal: Negative.  Negative for arthralgias.  Skin: Negative.   Neurological: Negative.  Negative for dizziness and weakness.  Hematological:  Negative for adenopathy. Does not bruise/bleed easily.  Psychiatric/Behavioral: Negative.      Objective:  BP (!) 144/92 (BP Location: Left Arm, Patient Position: Sitting, Cuff Size: Large)   Pulse 86   Temp 98.1 F (36.7 C) (Oral)   Ht 5\' 3"  (1.6 m)   Wt 190 lb (86.2 kg)   SpO2 98%   BMI 33.66 kg/m   BP Readings from Last 3 Encounters:  08/24/22 (!) 144/92  04/03/18 130/80  12/12/17 139/82    Wt Readings from Last 3 Encounters:  08/24/22 190 lb (86.2 kg)  04/03/18 180 lb 12 oz (82 kg)  12/12/17 175 lb (79.4 kg)    Physical Exam Vitals reviewed.  Constitutional:      Appearance: Normal appearance.  HENT:     Mouth/Throat:     Mouth: Mucous membranes are moist.  Eyes:     General: No scleral icterus.    Conjunctiva/sclera: Conjunctivae normal.  Cardiovascular:     Rate and Rhythm: Normal rate and regular rhythm.     Heart sounds: Normal heart sounds, S1 normal and S2 normal. No murmur heard.    No gallop.     Comments: EKG- NSR, 76  bpm Low voltage c/w body No LVH or Q waves Pulmonary:     Effort: Pulmonary effort is normal.     Breath sounds: No stridor. No wheezing, rhonchi or rales.  Abdominal:     General: Abdomen is flat.     Palpations: There is no mass.     Tenderness: There is no abdominal tenderness. There is no guarding.     Hernia: No hernia is present.  Musculoskeletal:     Cervical back: Neck supple.     Right lower leg: No edema.     Left lower leg: No edema.  Lymphadenopathy:     Cervical: No cervical adenopathy.  Skin:    General: Skin is warm and dry.  Neurological:     General: No focal deficit present.     Mental Status: She is alert. Mental status is at baseline.  Psychiatric:        Mood and Affect: Mood normal.         Behavior: Behavior normal.     Lab Results  Component Value Date   WBC 6.5 08/24/2022   HGB 14.2 08/24/2022   HCT 40.6 08/24/2022   PLT 256.0 08/24/2022   GLUCOSE 104 (H) 08/24/2022   CHOL 200 08/24/2022   TRIG 111.0 08/24/2022   HDL 47.00 08/24/2022   LDLCALC 130 (H) 08/24/2022   ALT 15 08/24/2022   AST 19 08/24/2022   NA 140 08/24/2022   K 4.5 08/24/2022   CL 104 08/24/2022   CREATININE 0.71 08/24/2022   BUN 13 08/24/2022   CO2 28 08/24/2022   TSH 2.06 08/24/2022   HGBA1C 5.6 08/24/2022    MM 3D SCREEN BREAST BILATERAL  Result Date: 07/03/2018 CLINICAL DATA:  Screening. EXAM: DIGITAL SCREENING BILATERAL MAMMOGRAM WITH TOMO AND CAD COMPARISON:  Previous exam(s). ACR Breast Density Category b: There are scattered areas of fibroglandular density. FINDINGS: There are no findings suspicious for malignancy. Images were processed with CAD. IMPRESSION: No mammographic evidence of malignancy. A result letter of this screening mammogram will be mailed directly to the patient. RECOMMENDATION: Screening mammogram in one year. (Code:SM-B-01Y) BI-RADS CATEGORY  1: Negative. Electronically Signed   By: Lillia Mountain M.D.   On: 07/03/2018 08:08    Assessment & Plan:   Aaron was seen today for annual exam and hypertension.  Diagnoses and all orders for this visit:  Essential hypertension- EKG is negative for LVH.  Labs are negative for secondary causes or endorgan damage.  She is not willing to take an antihypertensive but will continue to work on her lifestyle modifications.  Will recheck her blood pressure in 4 to 6 months. -     TSH; Future -     Urinalysis, Routine w reflex microscopic; Future -     CBC with Differential/Platelet; Future -     Basic metabolic panel; Future -     EKG 12-Lead -     Basic metabolic panel -     CBC with Differential/Platelet -     Urinalysis, Routine w reflex microscopic -     TSH  Hyperlipidemia LDL goal <130- Statin is not indicated. -      Lipid panel; Future -     TSH; Future -     Hepatic function panel; Future -     Hepatic function panel -     TSH -     Lipid panel  Hyperglycemia -     Hemoglobin A1c; Future -     Basic metabolic  panel; Future -     Basic metabolic panel -     Hemoglobin A1c  Encounter for general adult medical examination with abnormal findings- Exam completed, labs reviewed, vaccines reviewed, cancer screenings addressed, patient education was given. -     Hepatitis C antibody; Future -     HIV Antibody (routine testing w rflx); Future -     HIV Antibody (routine testing w rflx) -     Hepatitis C antibody  Need for hepatitis C screening test -     Hepatitis C antibody; Future -     Hepatitis C antibody  Screen for colon cancer -     Cologuard  Visit for screening mammogram -     MM DIGITAL SCREENING BILATERAL; Future  Cervical cancer screening -     Ambulatory referral to Gynecology   I have discontinued Rudie C. Schleifer "Chris"'s cetirizine, mometasone, montelukast, ALPRAZolam, naproxen, and omeprazole. I am also having her maintain her cholecalciferol, vitamin B-12, MAGNESIUM PO, Melatonin, and Rewetting Drops.  No orders of the defined types were placed in this encounter.    Follow-up: Return in about 6 months (around 02/22/2023).  Sanda Linger, MD

## 2022-08-24 NOTE — Patient Instructions (Signed)
Hypertension, Adult High blood pressure (hypertension) is when the force of blood pumping through the arteries is too strong. The arteries are the blood vessels that carry blood from the heart throughout the body. Hypertension forces the heart to work harder to pump blood and may cause arteries to become narrow or stiff. Untreated or uncontrolled hypertension can lead to a heart attack, heart failure, a stroke, kidney disease, and other problems. A blood pressure reading consists of a higher number over a lower number. Ideally, your blood pressure should be below 120/80. The first ("top") number is called the systolic pressure. It is a measure of the pressure in your arteries as your heart beats. The second ("bottom") number is called the diastolic pressure. It is a measure of the pressure in your arteries as the heart relaxes. What are the causes? The exact cause of this condition is not known. There are some conditions that result in high blood pressure. What increases the risk? Certain factors may make you more likely to develop high blood pressure. Some of these risk factors are under your control, including: Smoking. Not getting enough exercise or physical activity. Being overweight. Having too much fat, sugar, calories, or salt (sodium) in your diet. Drinking too much alcohol. Other risk factors include: Having a personal history of heart disease, diabetes, high cholesterol, or kidney disease. Stress. Having a family history of high blood pressure and high cholesterol. Having obstructive sleep apnea. Age. The risk increases with age. What are the signs or symptoms? High blood pressure may not cause symptoms. Very high blood pressure (hypertensive crisis) may cause: Headache. Fast or irregular heartbeats (palpitations). Shortness of breath. Nosebleed. Nausea and vomiting. Vision changes. Severe chest pain, dizziness, and seizures. How is this diagnosed? This condition is diagnosed by  measuring your blood pressure while you are seated, with your arm resting on a flat surface, your legs uncrossed, and your feet flat on the floor. The cuff of the blood pressure monitor will be placed directly against the skin of your upper arm at the level of your heart. Blood pressure should be measured at least twice using the same arm. Certain conditions can cause a difference in blood pressure between your right and left arms. If you have a high blood pressure reading during one visit or you have normal blood pressure with other risk factors, you may be asked to: Return on a different day to have your blood pressure checked again. Monitor your blood pressure at home for 1 week or longer. If you are diagnosed with hypertension, you may have other blood or imaging tests to help your health care provider understand your overall risk for other conditions. How is this treated? This condition is treated by making healthy lifestyle changes, such as eating healthy foods, exercising more, and reducing your alcohol intake. You may be referred for counseling on a healthy diet and physical activity. Your health care provider may prescribe medicine if lifestyle changes are not enough to get your blood pressure under control and if: Your systolic blood pressure is above 130. Your diastolic blood pressure is above 80. Your personal target blood pressure may vary depending on your medical conditions, your age, and other factors. Follow these instructions at home: Eating and drinking  Eat a diet that is high in fiber and potassium, and low in sodium, added sugar, and fat. An example of this eating plan is called the DASH diet. DASH stands for Dietary Approaches to Stop Hypertension. To eat this way: Eat   plenty of fresh fruits and vegetables. Try to fill one half of your plate at each meal with fruits and vegetables. Eat whole grains, such as whole-wheat pasta, brown rice, or whole-grain bread. Fill about one  fourth of your plate with whole grains. Eat or drink low-fat dairy products, such as skim milk or low-fat yogurt. Avoid fatty cuts of meat, processed or cured meats, and poultry with skin. Fill about one fourth of your plate with lean proteins, such as fish, chicken without skin, beans, eggs, or tofu. Avoid pre-made and processed foods. These tend to be higher in sodium, added sugar, and fat. Reduce your daily sodium intake. Many people with hypertension should eat less than 1,500 mg of sodium a day. Do not drink alcohol if: Your health care provider tells you not to drink. You are pregnant, may be pregnant, or are planning to become pregnant. If you drink alcohol: Limit how much you have to: 0-1 drink a day for women. 0-2 drinks a day for men. Know how much alcohol is in your drink. In the [place] one drink equals one 12 oz bottle of beer (355 mL), one 5 oz glass of wine (148 mL), or one 1 oz glass of hard liquor (44 mL). Lifestyle  Work with your health care provider to maintain a healthy body weight or to lose weight. Ask what an ideal weight is for you. Get at least 30 minutes of exercise that causes your heart to beat faster (aerobic exercise) most days of the week. Activities may include walking, swimming, or biking. Include exercise to strengthen your muscles (resistance exercise), such as Pilates or lifting weights, as part of your weekly exercise routine. Try to do these types of exercises for 30 minutes at least 3 days a week. Do not use any products that contain nicotine or tobacco. These products include cigarettes, chewing tobacco, and vaping devices, such as e-cigarettes. If you need help quitting, ask your health care provider. Monitor your blood pressure at home as told by your health care provider. Keep all follow-up visits. This is important. Medicines Take over-the-counter and prescription medicines only as told by your health care provider. Follow directions carefully. Blood  pressure medicines must be taken as prescribed. Do not skip doses of blood pressure medicine. Doing this puts you at risk for problems and can make the medicine less effective. Ask your health care provider about side effects or reactions to medicines that you should watch for. Contact a health care provider if you: Think you are having a reaction to a medicine you are taking. Have headaches that keep coming back (recurring). Feel dizzy. Have swelling in your ankles. Have trouble with your vision. Get help right away if you: Develop a severe headache or confusion. Have unusual weakness or numbness. Feel faint. Have severe pain in your chest or abdomen. Vomit repeatedly. Have trouble breathing. These symptoms may be an emergency. Get help right away. Call 911. Do not wait to see if the symptoms will go away. Do not drive yourself to the hospital. Summary Hypertension is when the force of blood pumping through your arteries is too strong. If this condition is not controlled, it may put you at risk for serious complications. Your personal target blood pressure may vary depending on your medical conditions, your age, and other factors. For most people, a normal blood pressure is less than 120/80. Hypertension is treated with lifestyle changes, medicines, or a combination of both. Lifestyle changes include losing weight, eating a healthy,   low-sodium diet, exercising more, and limiting alcohol. This information is not intended to replace advice given to you by your health care provider. Make sure you discuss any questions you have with your health care provider. Document Revised: 05/31/2021 Document Reviewed: 05/31/2021 Elsevier Patient Education  2023 [employer]  

## 2022-08-25 LAB — HEPATITIS C ANTIBODY: Hepatitis C Ab: NONREACTIVE

## 2022-08-25 LAB — HIV ANTIBODY (ROUTINE TESTING W REFLEX): HIV 1&2 Ab, 4th Generation: NONREACTIVE

## 2022-09-04 DIAGNOSIS — Z1211 Encounter for screening for malignant neoplasm of colon: Secondary | ICD-10-CM | POA: Diagnosis not present

## 2022-09-07 ENCOUNTER — Ambulatory Visit
Admission: RE | Admit: 2022-09-07 | Discharge: 2022-09-07 | Disposition: A | Discharge status: Home / Self Care | Payer: BC Managed Care – PPO | Source: Ambulatory Visit | Attending: Internal Medicine | Admitting: Internal Medicine

## 2022-09-07 DIAGNOSIS — Z1231 Encounter for screening mammogram for malignant neoplasm of breast: Secondary | ICD-10-CM

## 2022-09-08 ENCOUNTER — Other Ambulatory Visit: Payer: Self-pay | Admitting: Internal Medicine

## 2022-09-08 DIAGNOSIS — R928 Other abnormal and inconclusive findings on diagnostic imaging of breast: Secondary | ICD-10-CM

## 2022-09-14 ENCOUNTER — Ambulatory Visit: Payer: BC Managed Care – PPO

## 2022-09-14 ENCOUNTER — Ambulatory Visit
Admission: RE | Admit: 2022-09-14 | Discharge: 2022-09-14 | Disposition: A | Discharge status: Home / Self Care | Payer: BC Managed Care – PPO | Source: Ambulatory Visit | Attending: Internal Medicine | Admitting: Internal Medicine

## 2022-09-14 DIAGNOSIS — R922 Inconclusive mammogram: Secondary | ICD-10-CM | POA: Diagnosis not present

## 2022-09-14 DIAGNOSIS — R928 Other abnormal and inconclusive findings on diagnostic imaging of breast: Secondary | ICD-10-CM

## 2022-09-14 LAB — COLOGUARD: COLOGUARD: NEGATIVE

## 2022-09-21 ENCOUNTER — Ambulatory Visit: Payer: BC Managed Care – PPO | Admitting: Nurse Practitioner

## 2022-09-21 ENCOUNTER — Other Ambulatory Visit (HOSPITAL_COMMUNITY)
Admission: RE | Admit: 2022-09-21 | Discharge: 2022-09-21 | Disposition: A | Discharge status: Home / Self Care | Payer: BC Managed Care – PPO | Source: Ambulatory Visit | Attending: Nurse Practitioner | Admitting: Nurse Practitioner

## 2022-09-21 ENCOUNTER — Encounter: Payer: Self-pay | Admitting: Nurse Practitioner

## 2022-09-21 VITALS — BP 118/80 | HR 115 | Ht 62.25 in | Wt 189.0 lb

## 2022-09-21 DIAGNOSIS — Z124 Encounter for screening for malignant neoplasm of cervix: Secondary | ICD-10-CM | POA: Insufficient documentation

## 2022-09-21 DIAGNOSIS — Z780 Asymptomatic menopausal state: Secondary | ICD-10-CM

## 2022-09-21 DIAGNOSIS — Z01419 Encounter for gynecological examination (general) (routine) without abnormal findings: Secondary | ICD-10-CM | POA: Diagnosis not present

## 2022-09-21 LAB — HM PAP SMEAR

## 2022-09-21 NOTE — Progress Notes (Signed)
   Hannah Miranda Apr 22, 1966 [id number]   History:  57 y.o. G0 presents as new patient to establish care. No GYN complaints. Postmenopausal - no HRT, no bleeding. Normal pap history.   Gynecologic History No LMP recorded. Patient is postmenopausal.   Contraception/Family planning: post menopausal status Sexually active: No  Health Maintenance Last Pap: 02/25/2018. Results were: Normal neg HPV Last mammogram: 09/07/2022. Results were: Possible right breast asymmetry, normal follow up imaging Last colonoscopy: Never. Negative Cologuard 08/2022 Last Dexa: Not indicated  Past medical history, past surgical history, family history and social history were all reviewed and documented in the EPIC chart. Married. Works remote in [employer].  ROS:  A ROS was performed and pertinent positives and negatives are included.  Exam:  Vitals:   09/21/22 0853  BP: 118/80  Pulse: (!) 115  SpO2: 96%  Weight: 189 lb (85.7 kg)  Height: 5' 2.25" (1.581 m)   Body mass index is 34.29 kg/m.  General appearance:  Normal Thyroid:  Symmetrical, normal in size, without palpable masses or nodularity. Respiratory  Auscultation:  Clear without wheezing or rhonchi Cardiovascular  Auscultation:  Regular rate, without rubs, murmurs or gallops  Edema/varicosities:  Not grossly evident Abdominal  Soft,nontender, without masses, guarding or rebound.  Liver/spleen:  No organomegaly noted  Hernia:  None appreciated  Skin  Inspection:  Grossly normal Breasts: Examined lying and sitting.   Right: Without masses, retractions, nipple discharge or axillary adenopathy.   Left: Without masses, retractions, nipple discharge or axillary adenopathy. Genitourinary   Inguinal/mons:  Normal without inguinal adenopathy  External genitalia:  Normal appearing vulva with no masses, tenderness, or lesions  BUS/Urethra/Skene's glands:  Normal  Vagina:  Normal appearing with normal color and discharge, no lesions  Cervix:   Normal appearing without discharge or lesions  Uterus:  Normal in size, shape and contour.  Midline and mobile, nontender  Adnexa/parametria:     Rt: Normal in size, without masses or tenderness.   Lt: Normal in size, without masses or tenderness.  Anus and perineum: Normal  Digital rectal exam: Not indicated  Patient informed chaperone available to be present for breast and pelvic exam. Patient has requested no chaperone to be present. Patient has been advised what will be completed during breast and pelvic exam.   Assessment/Plan:  57 y.o. G0 to establish care.   Well female exam with routine gynecological exam - Education provided on SBEs, importance of preventative screenings, current guidelines, high calcium diet, regular exercise, and multivitamin daily. Labs with PCP.   Screening for cervical cancer - Plan: Cytology - PAP( [place]). Normal pap history.   Postmenopausal - no HRT, no bleeding  Screening for breast cancer - Normal mammogram history.  Continue annual screenings.  Normal breast exam today.  Screening for colon cancer - Negative Cologuard 08/2022.   Screening for osteoporosis - Average risk. Will plan DXA at age 50.   Return in 1 year for annual.     Tamela Gammon DNP, 9:06 AM 09/21/2022

## 2022-09-25 LAB — CYTOLOGY - PAP
Comment: NEGATIVE
Diagnosis: NEGATIVE
High risk HPV: NEGATIVE
